# Patient Record
Sex: Male | Born: 1965
Health system: Western US, Academic
[De-identification: ages and names within clinical notes are randomized; demographics above are authoritative.]

## PROBLEM LIST (undated history)

## (undated) DIAGNOSIS — L0291 Cutaneous abscess, unspecified: Secondary | ICD-10-CM

## (undated) DIAGNOSIS — L0390 Cellulitis, unspecified: Secondary | ICD-10-CM

## (undated) HISTORY — PX: HIP SURGERY: SHX245

## (undated) HISTORY — PX: OTHER SURGICAL HISTORY: SHX169

---

## 2003-04-07 ENCOUNTER — Emergency Department (HOSPITAL_COMMUNITY): Admission: EM | Admit: 2003-04-07 | Discharge: 2003-04-07 | Payer: Self-pay | Admitting: Emergency Medicine

## 2003-04-07 ENCOUNTER — Encounter: Payer: Self-pay | Admitting: Emergency Medicine

## 2007-12-08 ENCOUNTER — Other Ambulatory Visit (HOSPITAL_COMMUNITY): Admission: RE | Admit: 2007-12-08 | Discharge: 2007-12-17 | Payer: Self-pay | Admitting: Psychiatry

## 2008-08-26 ENCOUNTER — Emergency Department (HOSPITAL_COMMUNITY): Admission: EM | Admit: 2008-08-26 | Discharge: 2008-08-26 | Payer: Self-pay | Admitting: Emergency Medicine

## 2008-11-27 ENCOUNTER — Emergency Department (HOSPITAL_COMMUNITY): Admission: EM | Admit: 2008-11-27 | Discharge: 2008-11-27 | Payer: Self-pay | Admitting: Emergency Medicine

## 2011-06-25 LAB — URINE DRUGS OF ABUSE SCREEN W ALC, ROUTINE (REF LAB)
Amphetamine Screen, Ur: NEGATIVE
Marijuana Metabolite: NEGATIVE
Opiate Screen, Urine: NEGATIVE
Propoxyphene: NEGATIVE

## 2014-06-24 ENCOUNTER — Encounter (HOSPITAL_COMMUNITY): Payer: Self-pay | Admitting: Emergency Medicine

## 2014-06-24 ENCOUNTER — Emergency Department (HOSPITAL_COMMUNITY): Payer: Self-pay

## 2014-06-24 ENCOUNTER — Emergency Department (HOSPITAL_COMMUNITY)
Admission: EM | Admit: 2014-06-24 | Discharge: 2014-06-24 | Disposition: A | Discharge status: Home / Self Care | Payer: Self-pay | Attending: Emergency Medicine | Admitting: Emergency Medicine

## 2014-06-24 DIAGNOSIS — L0390 Cellulitis, unspecified: Secondary | ICD-10-CM

## 2014-06-24 DIAGNOSIS — F17200 Nicotine dependence, unspecified, uncomplicated: Secondary | ICD-10-CM | POA: Insufficient documentation

## 2014-06-24 DIAGNOSIS — L03319 Cellulitis of trunk, unspecified: Principal | ICD-10-CM

## 2014-06-24 DIAGNOSIS — L0291 Cutaneous abscess, unspecified: Secondary | ICD-10-CM

## 2014-06-24 DIAGNOSIS — L02219 Cutaneous abscess of trunk, unspecified: Secondary | ICD-10-CM | POA: Insufficient documentation

## 2014-06-24 HISTORY — DX: Cellulitis, unspecified: L03.90

## 2014-06-24 HISTORY — DX: Cutaneous abscess, unspecified: L02.91

## 2014-06-24 MED ORDER — MORPHINE SULFATE 4 MG/ML IJ SOLN
4.0000 mg | Freq: Once | INTRAMUSCULAR | Status: AC
  Administered 2014-06-24: 4 mg via INTRAMUSCULAR
  Filled 2014-06-24: qty 1

## 2014-06-24 MED ORDER — CEPHALEXIN 500 MG PO CAPS: 500.0000 mg | ORAL_CAPSULE | Freq: Four times a day (QID) | ORAL | Status: DC

## 2014-06-24 MED ORDER — OXYCODONE-ACETAMINOPHEN 5-325 MG PO TABS: 2.0000 | ORAL_TABLET | ORAL | Status: DC | PRN

## 2014-06-24 MED ORDER — OXYCODONE-ACETAMINOPHEN 5-325 MG PO TABS
2.0000 | ORAL_TABLET | Freq: Once | ORAL | Status: AC
  Administered 2014-06-24: 2 via ORAL
  Filled 2014-06-24: qty 2

## 2014-06-24 MED ORDER — SULFAMETHOXAZOLE-TRIMETHOPRIM 800-160 MG PO TABS: 2.0000 | ORAL_TABLET | Freq: Two times a day (BID) | ORAL | Status: DC

## 2014-06-24 MED ORDER — LIDOCAINE HCL 1 % IJ SOLN
5.0000 mL | Freq: Once | INTRAMUSCULAR | Status: AC
  Administered 2014-06-24: 5 mL via INTRADERMAL
  Filled 2014-06-24: qty 20

## 2014-06-24 NOTE — Discharge Instructions (Signed)
Take Keflex and Bactrim as directed until gone. Take Percocet as needed for pain. Return to the ED in 2 days for recheck. Refer to attached documents for more information.

## 2014-06-24 NOTE — ED Provider Notes (Signed)
CSN: [id number]     Arrival date & time 06/24/14  1355 History  This chart was scribed for non-physician practitioner Emilia Beck, PA-C, working with Rolland Porter, MD by Littie Deeds, ED Scribe. This patient was seen in room WTR7/WTR7 and the patient's care was started at 2:33 PM.     Chief Complaint  Patient presents with  . Abscess      The history is provided by the patient. No language interpreter was used.   HPI Comments: Jesus Keller is a 48 y.o. male with a hx of abscesses who presents to the Emergency Department complaining of gradually a worsening abscess on his right chest with associated pain that started 9 days ago. Patient has a smaller abscess on his back. He went to a hospital in [facility] for I&D 5 days ago. 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 Patient says the symptoms have not been improving on the abx. He states his head is "spurting out stuff" and that he is not feeling well in the head. Patient notes he has been working a lot and not sleeping enough. He denies fever, abdominal pain, nausea and diarrhea. Patient has a hx of IVDA and his last injection was 6 months ago. He states that he had a flare-up with his abscesses 6 months ago.   Past Medical History  Diagnosis Date  . Abscess   . Cellulitis    Past Surgical History  Procedure Laterality Date  . Arm surgery    . Hip surgery     No family history on file. History  Substance Use Topics  . Smoking status: Light Tobacco Smoker  . Smokeless tobacco: Not on file  . Alcohol Use: Yes    Review of Systems  Skin: Positive for wound (abscesses).  All other systems reviewed and are negative.     Allergies  Review of patient's allergies indicates no known allergies.  Home Medications   Prior to Admission medications   Not on File   BP 128/87  Pulse 83  Temp(Src) 99.2 F (37.3 C) (Oral)  Resp 16  Ht  (1.88 m)  Wt 160 lb (72.576  kg)  BMI 20.53 kg/m2  SpO2 100% Physical Exam  Nursing note and vitals reviewed. Constitutional: He is oriented to person, place, and time. He appears well-developed and well-nourished. No distress.  HENT:  Head: Normocephalic and atraumatic.  Mouth/Throat: Oropharynx is clear and moist. No oropharyngeal exudate.  Eyes: EOM are normal. Pupils are equal, round, and reactive to light.  Neck: Neck supple.  Cardiovascular: Normal rate.   Pulmonary/Chest: Effort normal and breath sounds normal. No respiratory distress. He has no wheezes. He has no rales.  4x4cm raised, indurated, closed wound of right chest with overlying crusted skin and surrounding erythema. The area is tender to palpation. No streaking noted.   Abdominal: Soft. He exhibits no distension. There is no tenderness. There is no rebound.  Musculoskeletal: He exhibits no edema.  Neurological: He is alert and oriented to person, place, and time. No cranial nerve deficit.  Skin: Skin is warm and dry. No rash noted.  Psychiatric: He has a normal mood and affect. His behavior is normal.    ED Course  Procedures  DIAGNOSTIC STUDIES: Oxygen Saturation is 100% on RA, nml by my interpretation.    COORDINATION OF CARE: 2:43 PM-Discussed treatment plan which includes labs and CXR with pt at bedside and pt agreed to plan.  INCISION AND DRAINAGE PROCEDURE NOTE: Patient identification was confirmed and verbal consent was obtained. This procedure was performed by Emilia Beck at 4:30pm. Site: right chest Needle size: 25 g Anesthetic used (type and amt): lidocaine 1% without epi Blade size: 11 Drainage: small amount Complexity: Complex Packing used: yes Site anesthetized, incision made over site, wound drained and explored loculations, rinsed with copious amounts of normal saline, wound packed with sterile gauze, covered with dry, sterile dressing.  Pt tolerated procedure well without complications.  Instructions for  care discussed verbally and pt provided with additional written instructions for homecare and f/u.  Labs Review Labs Reviewed - No data to display  Imaging Review No results found.   EKG Interpretation None      MDM   Final diagnoses:  Abscess and cellulitis    Patient's abscess drained with small amount of copious drainage. Wound cultured. Patient mildly tachycardic at 102 on my exam. Patient will have Bactrim, Keflex and Percocet for pain. Patient instructed to return in 2 days for wound check. Patient instructed to return to the ED with worsening or concerning symptoms. Patient's chest xray unremarkable for acute changes.   I personally performed the services described in this documentation, which was scribed in my presence. The recorded information has been reviewed and is accurate.    Emilia Beck, PA-C 06/24/14 1708

## 2014-06-24 NOTE — ED Notes (Signed)
PT REPORTS HE LANCED WOUND WITH HIS OWN NEEDLE PER THIS DISCHARGE  INSTRUCTIONS

## 2014-06-24 NOTE — ED Provider Notes (Signed)
Medical screening examination/treatment/procedure(s) were performed by non-physician practitioner and as supervising physician I was immediately available for consultation/collaboration.   EKG Interpretation None        Audree Camel, MD 06/24/14 2322

## 2014-06-24 NOTE — ED Notes (Signed)
Pt presents with NAD. Pt reports abscess to rt chest and small one to left back. Seen 3 days ago.at [facility] (06-20-2014) for presenting complaint. I&D performed  And given RX SULFAMETHOXAZOLE. Pt here for re evaluation. "feels worse".

## 2014-06-26 ENCOUNTER — Emergency Department (HOSPITAL_COMMUNITY)
Admission: EM | Admit: 2014-06-26 | Discharge: 2014-06-26 | Disposition: A | Discharge status: Home / Self Care | Payer: Self-pay | Attending: Emergency Medicine | Admitting: Emergency Medicine

## 2014-06-26 ENCOUNTER — Encounter (HOSPITAL_COMMUNITY): Payer: Self-pay | Admitting: Emergency Medicine

## 2014-06-26 DIAGNOSIS — Z8614 Personal history of Methicillin resistant Staphylococcus aureus infection: Secondary | ICD-10-CM | POA: Insufficient documentation

## 2014-06-26 DIAGNOSIS — Z4801 Encounter for change or removal of surgical wound dressing: Secondary | ICD-10-CM | POA: Insufficient documentation

## 2014-06-26 DIAGNOSIS — Z792 Long term (current) use of antibiotics: Secondary | ICD-10-CM | POA: Insufficient documentation

## 2014-06-26 DIAGNOSIS — Z5189 Encounter for other specified aftercare: Secondary | ICD-10-CM

## 2014-06-26 DIAGNOSIS — F17200 Nicotine dependence, unspecified, uncomplicated: Secondary | ICD-10-CM | POA: Insufficient documentation

## 2014-06-26 MED ORDER — LIDOCAINE-EPINEPHRINE (PF) 2 %-1:200000 IJ SOLN: 10.0000 mL | Freq: Once | INTRAMUSCULAR | Status: DC

## 2014-06-26 MED ORDER — LIDOCAINE-EPINEPHRINE 2 %-1:100000 IJ SOLN
INTRAMUSCULAR | Status: AC
  Filled 2014-06-26: qty 1

## 2014-06-26 NOTE — ED Provider Notes (Signed)
History/physical exam/procedure(s) were performed by non-physician practitioner and as supervising physician I was immediately available for consultation/collaboration. I have reviewed all notes and am in agreement with care and plan.   Hilario Quarry, MD 06/26/14 [phone]

## 2014-06-26 NOTE — Discharge Instructions (Signed)
Wound Check Your wound appears healthy today. Your wound will heal gradually over time. Eventually a scar will form that will fade with time. FACTORS THAT AFFECT SCAR FORMATION:  People differ in the severity in which they scar.  Scar severity varies according to location, size, and the traits you inherited from your parents (genetic predisposition).  Irritation to the wound from infection, rubbing, or chemical exposure will increase the amount of scar formation. HOME CARE INSTRUCTIONS   If you were given a dressing, you should change it at least once a day or as instructed by your caregiver. If the bandage sticks, soak it off with a solution of hydrogen peroxide.  If the bandage becomes wet, dirty, or develops a bad smell, change it as soon as possible.  Look for signs of infection.  Only take over-the-counter or prescription medicines for pain, discomfort, or fever as directed by your caregiver. SEEK IMMEDIATE MEDICAL CARE IF:   You have redness, swelling, or increasing pain in the wound.  You notice pus coming from the wound.  You have a fever.  You notice a bad smell coming from the wound or dressing. Document Released: 06/23/2004 Document Revised: 12/10/2011 Document Reviewed: 09/17/2005 [facility] Patient Information 2015 [place], [place]. This information is not intended to replace advice given to you by your health care provider. Make sure you discuss any questions you have with your health care provider.  Wound Care Wound care helps prevent pain and infection.  You may need a tetanus shot if:  You cannot remember when you had your last tetanus shot.  You have never had a tetanus shot.  The injury broke your skin. If you need a tetanus shot and you choose not to have one, you may get tetanus. Sickness from tetanus can be serious. HOME CARE   Only take medicine as told by your doctor.  Clean the wound daily with mild soap and water.  Change any bandages (dressings)  as told by your doctor.  Put medicated cream and a bandage on the wound as told by your doctor.  Change the bandage if it gets wet, dirty, or starts to smell.  Take showers. Do not take baths, swim, or do anything that puts your wound under water.  Rest and raise (elevate) the wound until the pain and puffiness (swelling) are better.  Keep all doctor visits as told. GET HELP RIGHT AWAY IF:   Yellowish-white fluid (pus) comes from the wound.  Medicine does not lessen your pain.  There is a red streak going away from the wound.  You have a fever. MAKE SURE YOU:   Understand these instructions.  Will watch your condition.  Will get help right away if you are not doing well or get worse. Document Released: 06/26/2008 Document Revised: 12/10/2011 Document Reviewed: 01/21/2011 [facility] Patient Information 2015 [place], [place]. This information is not intended to replace advice given to you by your health care provider. Make sure you discuss any questions you have with your health care provider.

## 2014-06-26 NOTE — ED Provider Notes (Signed)
CSN: [id number]     Arrival date & time 06/26/14  1503 History   First MD Initiated Contact with Patient 06/26/14 1505     Chief Complaint  Patient presents with  . Wound Check     (Consider location/radiation/quality/duration/timing/severity/associated sxs/prior Treatment) HPI  Jesus Keller is a(n) 48 y.o. male who presents to the Ed for wound check. The patient has a PMH of IVDU but states he has been clean for 6 months. He also has a hx of MRSA infection. He was seen in the ED 2 days ago for an abscess of the Right Chest wall. He has been packing it at home, taking bactrim and keflex. He states that the pain is greatly improved and that redness and swelling are also resolving. He denies fevers, chills, myalgiias, streaking.  Past Medical History  Diagnosis Date  . Abscess   . Cellulitis    Past Surgical History  Procedure Laterality Date  . Arm surgery    . Hip surgery     No family history on file. History  Substance Use Topics  . Smoking status: Light Tobacco Smoker  . Smokeless tobacco: Not on file  . Alcohol Use: Yes    Review of Systems  Ten systems reviewed and are negative for acute change, except as noted in the HPI.    Allergies  Review of patient's allergies indicates no known allergies.  Home Medications   Prior to Admission medications   Medication Sig Start Date End Date Taking? Authorizing Provider  cephALEXin (KEFLEX) 500 MG capsule Take 1 capsule (500 mg total) by mouth 4 (four) times daily. 06/24/14   Emilia Beck, PA-C  oxyCODONE-acetaminophen (PERCOCET/ROXICET) 5-325 MG per tablet Take 2 tablets by mouth every 4 (four) hours as needed for moderate pain or severe pain. 06/24/14   Kaitlyn Szekalski, PA-C  sulfamethoxazole-trimethoprim (SEPTRA DS) 800-160 MG per tablet Take 2 tablets by mouth every 12 (twelve) hours. 06/24/14   Kaitlyn Szekalski, PA-C   BP 152/93  Pulse 93  Temp(Src) 98.2 F (36.8 C) (Oral)  Resp 17  SpO2 99% Physical Exam    Nursing note and vitals reviewed. Constitutional: He appears well-developed and well-nourished. No distress.  HENT:  Head: Normocephalic and atraumatic.  Eyes: Conjunctivae are normal. No scleral icterus.  Neck: Normal range of motion. Neck supple.  Cardiovascular: Normal rate, regular rhythm and normal heart sounds.   Pulmonary/Chest: Effort normal and breath sounds normal. No respiratory distress.  4x4 cm open wound with necrotic tissue and wound edge undermining. No purulent discharge. 2-3 cm of surrounding erythema without streaking. Minimal tenderness.   Abdominal: Soft. There is no tenderness.  Musculoskeletal: He exhibits no edema.  Neurological: He is alert.  Skin: Skin is warm and dry. He is not diaphoretic.  Psychiatric: His behavior is normal.      ED Course  Debridement Date/Time: 06/26/2014 3:51 PM Performed by: Arthor Captain Authorized by: Arthor Captain Consent: Verbal consent obtained. Risks and benefits: risks, benefits and alternatives were discussed Patient identity confirmed: verbally with patient Time out: Immediately prior to procedure a "time out" was called to verify the correct patient, procedure, equipment, support staff and site/side marked as required. Preparation: Patient was prepped and draped in the usual sterile fashion. Local anesthesia used: yes Anesthesia: local infiltration Local anesthetic: lidocaine 2% with epinephrine Anesthetic total: 6 ml Patient sedated: no Comments: Necrotic tissue debrided from wound using forceps and 11 blade scalpel. Curette used to scrape down to fresh, bleeding tissue. Wound cleansed thoroughly with  sterile saline. No re-packing warranted. Sterile, nonstick bandage applied.   (including critical care time) Labs Review Labs Reviewed - No data to display  Imaging Review No results found.   EKG Interpretation None      MDM   Final diagnoses:  Visit for wound check  Encounter for wound care     Patient seen for wound check. Wound debrided and cleansed. I discussed return precautions using the teach back method. Patient will be discharged. Continue to cleanse wound 2x daily and change bandage.  contiue with abx and complete course.    Arthor Captain, PA-C 06/26/14 1605

## 2014-06-26 NOTE — ED Notes (Signed)
Pt here for recheck of wound to R upper chest. Ulcerated healing wound to R upper chest wall. Pt denies pain and states he thinks area is getting better. Pt reports taking Percocet x 2 today.

## 2014-06-27 LAB — WOUND CULTURE: Special Requests: NORMAL

## 2014-06-28 ENCOUNTER — Telehealth (HOSPITAL_BASED_OUTPATIENT_CLINIC_OR_DEPARTMENT_OTHER): Payer: Self-pay | Admitting: Emergency Medicine

## 2014-06-28 NOTE — Telephone Encounter (Signed)
Post ED Visit - Positive Culture Follow-up  Culture report reviewed by antimicrobial stewardship pharmacist:  Wes Dulaney, Pharm.D., BCPS  Celedonio Miyamoto, Pharm.D., BCPS  Georgina Pillion, Pharm.D., BCPS  Brent, [address].D., BCPS, AAHIVP  Estella Husk, Pharm.D., BCPS, AAHIVP  Carly Sabat, Pharm.D.  Enzo Bi, Pharm.D.  Positive wound staph  culture Treated with Cephalexin  po caps qid x 10 days, organism sensitive to the same and no further patient follow-up is required at this time.  Berle Mull 06/28/2014, 6:12 PM

## 2016-09-13 ENCOUNTER — Emergency Department (HOSPITAL_COMMUNITY)
Admission: EM | Admit: 2016-09-13 | Discharge: 2016-09-13 | Disposition: A | Discharge status: Home / Self Care | Payer: No Typology Code available for payment source | Attending: Physician Assistant | Admitting: Physician Assistant

## 2016-09-13 ENCOUNTER — Emergency Department (HOSPITAL_COMMUNITY): Payer: No Typology Code available for payment source

## 2016-09-13 ENCOUNTER — Encounter (HOSPITAL_COMMUNITY): Payer: Self-pay | Admitting: Emergency Medicine

## 2016-09-13 DIAGNOSIS — S82191D Other fracture of upper end of right tibia, subsequent encounter for closed fracture with routine healing: Secondary | ICD-10-CM | POA: Diagnosis not present

## 2016-09-13 DIAGNOSIS — S8991XD Unspecified injury of right lower leg, subsequent encounter: Secondary | ICD-10-CM | POA: Diagnosis present

## 2016-09-13 DIAGNOSIS — Z79899 Other long term (current) drug therapy: Secondary | ICD-10-CM | POA: Insufficient documentation

## 2016-09-13 DIAGNOSIS — S82831D Other fracture of upper and lower end of right fibula, subsequent encounter for closed fracture with routine healing: Secondary | ICD-10-CM | POA: Diagnosis not present

## 2016-09-13 DIAGNOSIS — S82101D Unspecified fracture of upper end of right tibia, subsequent encounter for closed fracture with routine healing: Secondary | ICD-10-CM

## 2016-09-13 DIAGNOSIS — F17200 Nicotine dependence, unspecified, uncomplicated: Secondary | ICD-10-CM | POA: Diagnosis not present

## 2016-09-13 DIAGNOSIS — S32040D Wedge compression fracture of fourth lumbar vertebra, subsequent encounter for fracture with routine healing: Secondary | ICD-10-CM | POA: Insufficient documentation

## 2016-09-13 MED ORDER — HYDROCODONE-ACETAMINOPHEN 5-325 MG PO TABS: 1.0000 | ORAL_TABLET | Freq: Four times a day (QID) | ORAL | 0 refills | Status: DC | PRN

## 2016-09-13 MED ORDER — HYDROCODONE-ACETAMINOPHEN 5-325 MG PO TABS
2.0000 | ORAL_TABLET | Freq: Once | ORAL | Status: AC
  Administered 2016-09-13: 2 via ORAL
  Filled 2016-09-13: qty 2

## 2016-09-13 MED FILL — HYDROCODON-APAP 5-325: 5-325 | 1 days supply | Qty: 15 | Fill #0

## 2016-09-13 NOTE — ED Triage Notes (Signed)
[facility]nee [facility]. Was seen then yet persistent pain . Pt ambulated to triage with steady gait.
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
## 2016-10-24 ENCOUNTER — Emergency Department (HOSPITAL_COMMUNITY)
Admission: EM | Admit: 2016-10-24 | Discharge: 2016-10-25 | Disposition: A | Discharge status: Home / Self Care | Payer: Self-pay | Attending: Emergency Medicine | Admitting: Emergency Medicine

## 2016-10-24 ENCOUNTER — Encounter (HOSPITAL_COMMUNITY): Payer: Self-pay | Admitting: Emergency Medicine

## 2016-10-24 DIAGNOSIS — K029 Dental caries, unspecified: Secondary | ICD-10-CM | POA: Insufficient documentation

## 2016-10-24 DIAGNOSIS — K0889 Other specified disorders of teeth and supporting structures: Secondary | ICD-10-CM

## 2016-10-24 DIAGNOSIS — F17200 Nicotine dependence, unspecified, uncomplicated: Secondary | ICD-10-CM | POA: Insufficient documentation

## 2016-10-24 MED ORDER — BUPIVACAINE-EPINEPHRINE (PF) 0.5% -1:200000 IJ SOLN
1.8000 mL | Freq: Once | INTRAMUSCULAR | Status: AC
  Administered 2016-10-25: 1.8 mL
  Filled 2016-10-24: qty 1.8

## 2016-10-24 MED ORDER — LIDOCAINE VISCOUS 2 % MT SOLN: 20.0000 mL | OROMUCOSAL | 2 refills | Status: DC | PRN

## 2016-10-24 NOTE — Discharge Instructions (Signed)
You have been seen today for dental pain. You should follow up with a dentist as soon as possible. This problem will not resolve on its own without the care of a dentist. Use ibuprofen or naproxen for pain. Use the viscous lidocaine for mouth pain. Swish with the lidocaine and spit it out. Do not swallow it. ° °
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
## 2016-10-24 NOTE — ED Notes (Signed)
See EDP assessment 

## 2016-10-24 NOTE — ED Triage Notes (Signed)
Patient with dental pain on the lower left jaw.  Patient thinks he may have an abscess.  Patient is complaining of back pain also.

## 2016-10-25 MED ORDER — PENICILLIN V POTASSIUM 500 MG PO TABS: 500.0000 mg | ORAL_TABLET | Freq: Four times a day (QID) | ORAL | 0 refills | Status: AC

## 2016-10-25 MED ORDER — IBUPROFEN 800 MG PO TABS: 800.0000 mg | ORAL_TABLET | Freq: Three times a day (TID) | ORAL | 0 refills | Status: DC

## 2016-10-25 NOTE — ED Notes (Signed)
Patient in room, with sunglasses on, stating he is tripping out.  He would like something to eat and drink and wants it now.  Patient was told to go back into room, or he can leave.  Patient opted to go back into room, sandwhich and water given.
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
## 2016-11-21 ENCOUNTER — Emergency Department (HOSPITAL_COMMUNITY): Payer: Self-pay

## 2016-11-21 ENCOUNTER — Emergency Department (HOSPITAL_COMMUNITY)
Admission: EM | Admit: 2016-11-21 | Discharge: 2016-11-21 | Disposition: A | Discharge status: Home / Self Care | Payer: Self-pay | Attending: Emergency Medicine | Admitting: Emergency Medicine

## 2016-11-21 ENCOUNTER — Encounter (HOSPITAL_COMMUNITY): Payer: Self-pay | Admitting: Emergency Medicine

## 2016-11-21 DIAGNOSIS — K0889 Other specified disorders of teeth and supporting structures: Secondary | ICD-10-CM | POA: Insufficient documentation

## 2016-11-21 DIAGNOSIS — M25561 Pain in right knee: Secondary | ICD-10-CM | POA: Insufficient documentation

## 2016-11-21 DIAGNOSIS — F17200 Nicotine dependence, unspecified, uncomplicated: Secondary | ICD-10-CM | POA: Insufficient documentation

## 2016-11-21 MED ORDER — PENICILLIN V POTASSIUM 500 MG PO TABS
500.0000 mg | ORAL_TABLET | Freq: Once | ORAL | Status: AC
  Administered 2016-11-21: 500 mg via ORAL
  Filled 2016-11-21: qty 1

## 2016-11-21 MED ORDER — PENICILLIN V POTASSIUM 500 MG PO TABS: 500.0000 mg | ORAL_TABLET | Freq: Three times a day (TID) | ORAL | 0 refills | Status: AC

## 2016-11-21 MED ORDER — OXYCODONE-ACETAMINOPHEN 5-325 MG PO TABS
1.0000 | ORAL_TABLET | Freq: Once | ORAL | Status: AC
  Administered 2016-11-21: 1 via ORAL
  Filled 2016-11-21: qty 1

## 2016-11-21 NOTE — ED Provider Notes (Signed)
[facility]-EMERGENCY DEPT Provider Note   CSN: [id number] Arrival date & time: 11/21/16  0144     History   Chief Complaint No chief complaint on file.   HPI Jesus Keller is a 51 y.o. male.  HPI  51 y.o. male presents to the Emergency Department today complaining of right knee pain from previous injury x several months. No acute injuries recently. Pt just wishes to have knee "checkd out." Also notes left and right dental pain due to fillings falling out x 1 month ago. States pain is constant and has no relief with OTC motrin. No fevers. No difficulty with PO intake. No N/V. No CP/SOB/ABD pain. Notes being from [place] and waiting to go back home to see his dentist and orthopedic surgeon.   Past Medical History:  Diagnosis Date  . Abscess   . Cellulitis     There are no active problems to display for this patient.   Past Surgical History:  Procedure Laterality Date  . arm surgery    . HIP SURGERY         Home Medications    Prior to Admission medications   Medication Sig Start Date End Date Taking? Authorizing Provider  cephALEXin (KEFLEX) 500 MG capsule Take 1 capsule (500 mg total) by mouth 4 (four) times daily. 06/24/14   Emilia Beck, PA-C  HYDROcodone-acetaminophen (NORCO/VICODIN) 5-325 MG tablet Take 1-2 tablets by mouth every 6 (six) hours as needed. 09/13/16   Roxy Horseman, PA-C  ibuprofen (ADVIL,MOTRIN) 800 MG tablet Take 1 tablet (800 mg total) by mouth 3 (three) times daily. 10/25/16   Shawn C Joy, PA-C  lidocaine (XYLOCAINE) 2 % solution Use as directed 20 mLs in the mouth or throat as needed for mouth pain. 10/24/16   Shawn C Joy, PA-C  oxyCODONE-acetaminophen (PERCOCET/ROXICET) 5-325 MG per tablet Take 2 tablets by mouth every 4 (four) hours as needed for moderate pain or severe pain. 06/24/14   Kaitlyn Szekalski, PA-C  sulfamethoxazole-trimethoprim (SEPTRA DS) 800-160 MG per tablet Take 2 tablets by mouth every 12 (twelve) hours. 06/24/14   Emilia Beck, PA-C    Family History No family history on file.  Social History Social History  Substance Use Topics  . Smoking status: Light Tobacco Smoker  . Smokeless tobacco: Never Used  . Alcohol use Yes     Allergies   Patient has no known allergies.   Review of Systems Review of Systems  Constitutional: Negative for fever.  HENT: Positive for dental problem.   Musculoskeletal: Positive for arthralgias.   Physical Exam Updated Vital Signs BP 128/92 (BP Location: Right Arm)   Pulse 93   Temp 98.3 F (36.8 C) (Oral)   Resp 18   Ht 6\' 2"  (1.88 m)   Wt 77.1 kg   SpO2 96%   BMI 21.83 kg/m   Physical Exam  Constitutional: He is oriented to person, place, and time. Vital signs are normal. He appears well-developed and well-nourished.  HENT:  Head: Normocephalic and atraumatic.  Right Ear: Hearing normal.  Left Ear: Hearing normal.  No trismus. No dental abscess. No uvula deviation. No swelling noted on gingiva. Poor dentition noted with cavities present.   Eyes: Conjunctivae and EOM are normal. Pupils are equal, round, and reactive to light.  Neck: Normal range of motion. Neck supple.  Cardiovascular: Normal rate and regular rhythm.   Pulmonary/Chest: Effort normal.  Musculoskeletal:  Right Knee: Negative anterior/poster drawer bilaterally. Negative ballottement test. No varus or valgus laxity. No crepitus. No  pain with flexion or extension. No TTP of knees or ankles.   Neurological: He is alert and oriented to person, place, and time.  Skin: Skin is warm and dry.  Psychiatric: He has a normal mood and affect. His speech is normal and behavior is normal. Thought content normal.  Nursing note and vitals reviewed.  ED Treatments / Results  Labs (all labs ordered are listed, but only abnormal results are displayed) Labs Reviewed - No data to display  EKG  EKG Interpretation None       Radiology Dg Knee Complete 4 Views Right  Result Date:  11/21/2016 CLINICAL DATA:  51 year old male with prior right knee fracture presenting knee pain. EXAM: RIGHT KNEE - COMPLETE 4+ VIEW COMPARISON:  Right knee radiograph dated 09/13/2016 FINDINGS: There is no acute fracture or dislocation. Old healed proximal fibular fracture noted. There is irregularity of the proximal tibia in the region of the previously seen fracture. A linear lucency with sclerotic margin noted extending from the anterior aspect of the tibial spine along the medial aspect of the medial intercondylar tubercle representing an incompletely healed fracture line. Trace suprapatellar effusion may be present. The soft tissues appear unremarkable. IMPRESSION: 1. No acute fracture or dislocation. 2. Linear lucency along the medial intercondylar tubercle representing an incompletely healed fracture. Clinical correlation and follow-up recommended. CT or MRI may provide better evaluation if clinically indicated. 3. Old healed fracture of the proximal fibula. Electronically Signed   By: Elgie CollardArash  Radparvar M.D.   On: 11/21/2016 03:23    Procedures Procedures (including critical care time)  Medications Ordered in ED Medications - No data to display   Initial Impression / Assessment and Plan / ED Course  I have reviewed the triage vital signs and the nursing notes.  Pertinent labs & imaging results that were available during my care of the patient were reviewed by me and considered in my medical decision making (see chart for details).  Final Clinical Impressions(s) / ED Diagnoses   {I have reviewed and evaluated the relevant imaging studies.  {I have reviewed the relevant previous healthcare records.  {I obtained HPI from historian.   ED Course:  Assessment: Pt is a 51yM who presents with right knee pain as well as dental pain x 1 month after fillings have fallen out. No fevers. No N/V On exam, pt in NAD. Nontoxic/nonseptic appearing. VSS. Afebrile. Dental pain associated with dental cary but  no signs or symptoms of dental abscess with patient afebrile, non toxic appearing and swallowing secretions well. Exam unconcerning for Ludwig's angina or other deep tissue infection in neck. Prophylactic Tx with ABX. Patient X-Ray negative for obvious fracture or dislocation. Old fracture noted. Counseled patient to continue NWB if possible and follow up with Ortho Conservative therapy recommended and discussed. Patient will be discharged home & is agreeable with above plan. Returns precautions discussed. Pt appears safe for discharge. At time of discharge, Patient is in no acute distress. Vital Signs are stable. Patient is able to ambulate. Patient able to tolerate PO.   Disposition/Plan:  DC Home Additional Verbal discharge instructions given and discussed with patient.  Pt Instructed to f/u with PCP in the next week for evaluation and treatment of symptoms. Return precautions given Pt acknowledges and agrees with plan  Supervising Physician April Palumbo, MD  Final diagnoses:  Acute pain of right knee  Pain, dental    New Prescriptions New Prescriptions   No medications on file     Audry Piliyler Londynn Sonoda, PA-C  11/21/16 0446    April Palumbo, MD 11/21/16 [phone]

## 2016-11-21 NOTE — Discharge Instructions (Signed)
Please read and follow all provided instructions.  Your diagnoses today include:  1. Acute pain of right knee   2. Pain, dental     Tests performed today include: Vital signs. See below for your results today.   Medications prescribed:  Take as prescribed   Home care instructions:  Follow any educational materials contained in this packet.  Follow-up instructions: Please follow-up with your primary care provider for further evaluation of symptoms and treatment   Return instructions:  Please return to the Emergency Department if you do not get better, if you get worse, or new symptoms OR  - Fever (temperature greater than 101.75F)  - Bleeding that does not stop with holding pressure to the area    -Severe pain (please note that you may be more sore the day after your accident)  - Chest Pain  - Difficulty breathing  - Severe nausea or vomiting  - Inability to tolerate food and liquids  - Passing out  - Skin becoming red around your wounds  - Change in mental status (confusion or lethargy)  - New numbness or weakness    Please return if you have any other emergent concerns.  Additional Information:  Your vital signs today were: BP 128/92 (BP Location: Right Arm)    Pulse 93    Temp 98.3 F (36.8 C) (Oral)    Resp 18    Ht 6\' 2"  (1.88 m)    Wt 77.1 kg    SpO2 96%    BMI 21.83 kg/m  If your blood pressure (BP) was elevated above 135/85 this visit, please have this repeated by your doctor within one month. --------------

## 2016-11-21 NOTE — ED Notes (Signed)
Pt c/o left upper jaw pain r/t to tooth no edema to face noted

## 2016-11-21 NOTE — ED Triage Notes (Signed)
Pt comes with complaints of right knee pain from a previous injury and wants to get it x-rayed again to make sure it is healed as it is supposed to. Pt had previous fillings in both the left and right side that have fell out.  Reports left sided jaw pain and believes he may have an infection and needs antibiotics. Pt states he does not want a block.

## 2017-05-27 ENCOUNTER — Inpatient Hospital Stay
Admit: 2017-05-27 | Discharge: 2017-05-27 | Disposition: A | Discharge status: Home / Self Care | Payer: Medicaid HMO | Source: Home / Self Care

## 2017-05-27 DIAGNOSIS — L0501 Pilonidal cyst with abscess: Secondary | ICD-10-CM

## 2017-05-27 MED ORDER — ACETAMINOPHEN-CODEINE #3 300-30 MG PO TABS
1-2 | ORAL_TABLET | Freq: Four times a day (QID) | ORAL | 0 refills | Status: AC | PRN
Start: 2017-05-27 — End: 2017-07-01

## 2017-05-27 MED ORDER — COTRIMOXAZOLE 800-160 MG PO TABS
1 | ORAL_TABLET | Freq: Two times a day (BID) | ORAL | 0 refills | Status: AC
Start: 2017-05-27 — End: ?

## 2017-05-27 MED ORDER — CEPHALEXIN 500 MG PO CAPS
500 mg | ORAL_CAPSULE | Freq: Four times a day (QID) | ORAL | 0 refills | Status: AC
Start: 2017-05-27 — End: 2017-07-01

## 2017-05-28 ENCOUNTER — Inpatient Hospital Stay
Admit: 2017-05-28 | Discharge: 2017-05-28 | Disposition: A | Discharge status: Home / Self Care | Payer: PRIVATE HEALTH INSURANCE | Source: Home / Self Care

## 2017-05-28 DIAGNOSIS — Z5189 Encounter for other specified aftercare: Secondary | ICD-10-CM

## 2017-06-21 DIAGNOSIS — M545 Low back pain: Secondary | ICD-10-CM

## 2017-06-22 ENCOUNTER — Inpatient Hospital Stay
Admit: 2017-06-22 | Discharge: 2017-06-22 | Disposition: A | Discharge status: Home / Self Care | Payer: Medicaid HMO | Source: Home / Self Care

## 2017-06-22 MED ORDER — IBUPROFEN 600 MG PO TABS
600 mg | ORAL_TABLET | Freq: Four times a day (QID) | ORAL | 0 refills | Status: AC | PRN
Start: 2017-06-22 — End: ?

## 2017-06-30 ENCOUNTER — Inpatient Hospital Stay
Admit: 2017-06-30 | Discharge: 2017-06-30 | Disposition: A | Discharge status: Home / Self Care | Payer: Medicaid HMO | Source: Home / Self Care

## 2017-06-30 DIAGNOSIS — L0231 Cutaneous abscess of buttock: Secondary | ICD-10-CM

## 2017-06-30 MED ORDER — MUPIROCIN 2 % EX OINT
0 refills | Status: AC
Start: 2017-06-30 — End: ?

## 2017-06-30 MED ORDER — DOXYCYCLINE HYCLATE 100 MG PO CAPS
100 mg | ORAL_CAPSULE | Freq: Two times a day (BID) | ORAL | 0 refills | Status: AC
Start: 2017-06-30 — End: ?

## 2017-12-25 ENCOUNTER — Emergency Department (HOSPITAL_COMMUNITY)
Admission: EM | Admit: 2017-12-25 | Discharge: 2017-12-25 | Disposition: A | Discharge status: Home / Self Care | Payer: PRIVATE HEALTH INSURANCE | Attending: Physician Assistant | Admitting: Physician Assistant

## 2017-12-25 ENCOUNTER — Encounter (HOSPITAL_COMMUNITY): Payer: Self-pay | Admitting: Emergency Medicine

## 2017-12-25 ENCOUNTER — Other Ambulatory Visit: Payer: Self-pay

## 2017-12-25 DIAGNOSIS — R202 Paresthesia of skin: Secondary | ICD-10-CM

## 2017-12-25 DIAGNOSIS — F17210 Nicotine dependence, cigarettes, uncomplicated: Secondary | ICD-10-CM | POA: Insufficient documentation

## 2017-12-25 DIAGNOSIS — M545 Low back pain: Secondary | ICD-10-CM | POA: Insufficient documentation

## 2017-12-25 DIAGNOSIS — G8929 Other chronic pain: Secondary | ICD-10-CM

## 2017-12-25 DIAGNOSIS — R200 Anesthesia of skin: Secondary | ICD-10-CM | POA: Insufficient documentation

## 2017-12-25 MED ORDER — PREDNISONE 10 MG PO TABS: 20.0000 mg | ORAL_TABLET | Freq: Every day | ORAL | 0 refills | Status: DC

## 2017-12-25 MED ORDER — PREDNISONE 20 MG PO TABS
40.0000 mg | ORAL_TABLET | Freq: Once | ORAL | Status: AC
  Administered 2017-12-25: 40 mg via ORAL
  Filled 2017-12-25: qty 2

## 2017-12-25 MED ORDER — PREDNISONE 20 MG PO TABS: 40.0000 mg | ORAL_TABLET | Freq: Every day | ORAL | 0 refills | Status: AC

## 2017-12-25 MED FILL — predniSONE 20 MG TABS: 20 | 4 days supply | Qty: 8 | Fill #0
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
## 2017-12-25 NOTE — Discharge Instructions (Signed)
Please see the information and instructions below regarding your visit.  Your diagnoses today include:  1. Chronic bilateral low back pain, with sciatica presence unspecified   2. Numbness of toes   3. Paresthesias in right hand    About diagnosis. Most episodes of acute low back pain are self-limited. Your exam was reassuring today that the source of your pain is not affecting the spinal cord and nerves that originate in the spinal cord.   If you have a history of disc herniation or arthritis in your spine, the nerves exiting the spine on one side get inflamed. This can cause severe pain. We call this radiculopathy. We do not always know what causes the sudden inflammation.  The sensation in your hand is appearing to come from a source in your elbow.  We will treat this first.  If this does not go away, this will require further workup.  You can start by putting your elbow in extension.  I gave a picture of the splint that she would use to do this.  Tests performed today include: See side panel of your discharge paperwork for testing performed today. Vital signs are listed at the bottom of these instructions.   Medications prescribed:    Take any prescribed medications only as prescribed, and any over the counter medications only as directed on the packaging.  You are prescribed prednisone a steroid. This is a medication to help reduce inflammation in the spine.  Common side effects include upset stomach/nausea. You may take this medicine with food if this occurs. Other side effects include restlessness, difficulty sleeping, and increased sweating. Call your healthcare provider if these do not resolve after finishing the medication.  This medicine may increase your blood sugar so additional careful monitoring is needed of blood sugar if you have diabetes. Call your healthcare provider for any signs/symtpoms of high blood sugar such as confusion, feeling sleepy, more thirst, more hunger,  passing urine more often, flushing, fast breathing, or breath that smells like fruit.   Home care instructions:   Low back pain gets worse the longer you stay stationary. Please keep moving and walking as tolerated. There are exercises included in this packet to perform as tolerated for your low back pain.   Apply heat to the areas that are painful. Avoid twisting or bending your trunk to lift something. Do not lift anything above 25 lbs while recovering from this flare of low back pain.  Please follow any educational materials contained in this packet.   Follow-up instructions: Please follow-up with your primary care provider as soon as possible for further evaluation of your symptoms if they are not completely improved.   Return instructions:  Please return to the Emergency Department if you experience worsening symptoms.  Please return for any fever or chills in the setting of your back pain, weakness in the muscles of the legs, numbness in your legs and feet that is new or changing, numbness in the area where you wipe, retention of your urine, loss of bowel or bladder control, or problems with walking. Please return if you have any other emergent concerns.  Additional Information:   Your vital signs today were: BP (!) 143/111 (BP Location: Left Arm)    Pulse 92    Temp 98.2 F (36.8 C) (Oral)    Resp 18    SpO2 98%  If your blood pressure (BP) was elevated on multiple readings during this visit above 130 for the top number or above  80 for the bottom number, please have this repeated by your primary care provider within one month. --------------  Thank you for allowing [place] to participate in your care today.

## 2017-12-31 ENCOUNTER — Emergency Department (HOSPITAL_COMMUNITY): Payer: Self-pay

## 2017-12-31 ENCOUNTER — Emergency Department (HOSPITAL_COMMUNITY)
Admission: EM | Admit: 2017-12-31 | Discharge: 2017-12-31 | Disposition: A | Discharge status: Home / Self Care | Payer: Self-pay | Attending: Emergency Medicine | Admitting: Emergency Medicine

## 2017-12-31 ENCOUNTER — Other Ambulatory Visit: Payer: Self-pay

## 2017-12-31 DIAGNOSIS — Z79899 Other long term (current) drug therapy: Secondary | ICD-10-CM | POA: Insufficient documentation

## 2017-12-31 DIAGNOSIS — F17200 Nicotine dependence, unspecified, uncomplicated: Secondary | ICD-10-CM | POA: Insufficient documentation

## 2017-12-31 DIAGNOSIS — M4726 Other spondylosis with radiculopathy, lumbar region: Secondary | ICD-10-CM | POA: Insufficient documentation
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
## 2017-12-31 NOTE — ED Triage Notes (Signed)
Pt complaint of continued extremity in both feet and back pain. Pt reports he did the prednisone therapy as prescribed and the pain has worsened and the numbness has worsened slightly.

## 2017-12-31 NOTE — ED Notes (Signed)
Patient transported to MRI 

## 2017-12-31 NOTE — Discharge Instructions (Signed)
Please follow up with neurosurgery. Information listed below.   Return to the ER for any new or concerning symptoms including loss of bowel or bladder control, new weakness.

## 2017-12-31 NOTE — ED Notes (Signed)
Patient given a gown and instructed to get undressed because MRI on way to come get him.

## 2018-03-13 ENCOUNTER — Emergency Department (HOSPITAL_COMMUNITY)
Admission: EM | Admit: 2018-03-13 | Discharge: 2018-03-14 | Disposition: A | Discharge status: Home / Self Care | Payer: Self-pay | Attending: Emergency Medicine | Admitting: Emergency Medicine

## 2018-03-13 ENCOUNTER — Other Ambulatory Visit: Payer: Self-pay

## 2018-03-13 ENCOUNTER — Encounter (HOSPITAL_COMMUNITY): Payer: Self-pay

## 2018-03-13 DIAGNOSIS — K0889 Other specified disorders of teeth and supporting structures: Secondary | ICD-10-CM | POA: Insufficient documentation

## 2018-03-13 DIAGNOSIS — F17210 Nicotine dependence, cigarettes, uncomplicated: Secondary | ICD-10-CM | POA: Insufficient documentation

## 2018-03-13 DIAGNOSIS — Z79899 Other long term (current) drug therapy: Secondary | ICD-10-CM | POA: Insufficient documentation

## 2018-03-13 NOTE — ED Notes (Signed)
Bed: WTR9 Expected date:  Expected time:  Means of arrival:  Comments: 

## 2018-03-13 NOTE — ED Triage Notes (Signed)
Pt reports lower L dental pain x2 days. He reports a hx of same and is requesting antibiotics. States that he has tried motrin at home. A&Ox4. Ambulatory.

## 2018-03-14 MED ORDER — AMOXICILLIN 500 MG PO CAPS
500.0000 mg | ORAL_CAPSULE | Freq: Two times a day (BID) | ORAL | Status: DC
  Administered 2018-03-14: 500 mg via ORAL
  Filled 2018-03-14: qty 1

## 2018-03-14 MED ORDER — AMOXICILLIN 500 MG PO CAPS: 500.0000 mg | ORAL_CAPSULE | Freq: Three times a day (TID) | ORAL | 0 refills | Status: AC

## 2018-03-14 MED ORDER — TRAMADOL HCL 50 MG PO TABS
50.0000 mg | ORAL_TABLET | Freq: Once | ORAL | Status: AC
  Administered 2018-03-14: 50 mg via ORAL
  Filled 2018-03-14: qty 1

## 2018-03-14 MED ORDER — TRAMADOL HCL 50 MG PO TABS: 50.0000 mg | ORAL_TABLET | Freq: Four times a day (QID) | ORAL | 0 refills | Status: AC | PRN

## 2018-03-14 NOTE — ED Notes (Signed)
Pt came out of room and states, "Im not fucking with you, its about to get bad."

## 2018-03-14 NOTE — ED Notes (Signed)
Pt declined dc vitals.

## 2018-03-14 NOTE — ED Provider Notes (Signed)
[facility]-EMERGENCY DEPT Provider Note   CSN: [id number] Arrival date & time: 03/13/18  2157     History   Chief Complaint Chief Complaint  Patient presents with  . Dental Pain    HPI Jesus Keller is a 52 y.o. male.  HPI 52 year old Caucasian male presents to the ED for evaluation of dental pain and abscess.  Patient reports history of dental caries.  He has required antibiotics in the past.  Patient is visiting his father from [place].  The pain has not been improved with Tylenol and ibuprofen.  Patient states that antibiotics have helped him in the past.  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  Patient denies any associated fevers or chills.  Denies any difficulties breathing or swallowing.  Nothing makes symptoms better or worse.  Patient is able to tolerate p.o. fluids and food. Past Medical History:  Diagnosis Date  . Abscess   . Cellulitis     There are no active problems to display for this patient.   Past Surgical History:  Procedure Laterality Date  . arm surgery    . HIP SURGERY          Home Medications    Prior to Admission medications   Medication Sig Start Date End Date Taking? Authorizing Provider  acetaminophen (TYLENOL) 500 MG tablet Take 1,000 mg by mouth every 6 (six) hours as needed for mild pain.    [provider]  amoxicillin (AMOXIL) 500 MG capsule Take 1 capsule (500 mg total) by mouth 3 (three) times daily. 03/14/18   Rise MuLeaphart, Keniel Ralston T, PA-C  Cholecalciferol (VITAMIN D PO) Take 1 tablet by mouth daily.    [provider]  Ginger, Zingiber officinalis, (GINGER PO) Take 1 tablet by mouth daily.    [provider]  ibuprofen (ADVIL,MOTRIN) 200 MG tablet Take 800 mg by mouth every 6 (six) hours as needed for moderate pain.    [provider]  MAGNESIUM PO Take 1 tablet by mouth daily.    [provider]  naproxen (NAPROSYN) 500 MG tablet Take 500 mg by mouth 2 (two) times daily with  a meal.    [provider]  Omega-3 Fatty Acids (OMEGA 3 PO) Take 1 tablet by mouth daily.    [provider]  predniSONE (DELTASONE) 20 MG tablet Take 40 mg by mouth daily with breakfast. 4-day supply prescribed on 3/27    [provider]  Pyridoxine HCl (VITAMIN B6 PO) Take 1 tablet by mouth daily.    [provider]  traMADol (ULTRAM) 50 MG tablet Take 1 tablet (50 mg total) by mouth every 6 (six) hours as needed. 03/14/18   Rise MuLeaphart, Keaundre Thelin T, PA-C    Family History History reviewed. No pertinent family history.  Social History Social History   Tobacco Use  . Smoking status: Light Tobacco Smoker  . Smokeless tobacco: Never Used  Substance Use Topics  . Alcohol use: Yes  . Drug use: Yes    Types: Marijuana     Allergies   Patient has no known allergies.   Review of Systems Review of Systems  Constitutional: Negative for chills and fever.  HENT: Positive for dental problem. Negative for drooling and trouble swallowing.   Gastrointestinal: Negative for vomiting.     Physical Exam Updated Vital Signs BP (!) 156/105 (BP Location: Left Arm)   Pulse (!) 102   Temp 98.2 F (36.8 C) (Oral)   Resp 18   SpO2 99%  Physical Exam  Constitutional: He appears well-developed and well-nourished. No distress.  HENT:  Head: Normocephalic and atraumatic.  Mouth/Throat: Uvula is midline, oropharynx is clear and moist and mucous membranes are normal. No trismus in the jaw. Abnormal dentition. Dental caries present. No uvula swelling. No tonsillar exudate.    Poor dentition throughout the entire mouth.  There are several broken teeth.  No gingival edema or erythema.  No gross abscess.  No sublingual or submandibular swelling.  Oropharynx is clear without any edema.  Patient managing secretions tolerating airway.  No muffled voice.  Eyes: Right eye exhibits no discharge. Left eye exhibits no discharge. No scleral icterus.  Neck: Normal range of  motion. Neck supple.  No nuchal rigidity noted.  Pulmonary/Chest: No respiratory distress.  Musculoskeletal: Normal range of motion.  Neurological: He is alert.  Skin: Skin is warm and dry. Capillary refill takes less than 2 seconds. No pallor.  Psychiatric: His behavior is normal. Judgment and thought content normal.  Nursing note and vitals reviewed.    ED Treatments / Results  Labs (all labs ordered are listed, but only abnormal results are displayed) Labs Reviewed - No data to display  EKG None  Radiology No results found.  Procedures Procedures (including critical care time)  Medications Ordered in ED Medications  traMADol (ULTRAM) tablet 50 mg (has no administration in time range)  amoxicillin (AMOXIL) capsule 500 mg (has no administration in time range)     Initial Impression / Assessment and Plan / ED Course  I have reviewed the triage vital signs and the nursing notes.  Pertinent labs & imaging results that were available during my care of the patient were reviewed by me and considered in my medical decision making (see chart for details).     Patient with toothache.  No gross abscess.  Exam unconcerning for Ludwig's angina or spread of infection.  Will treat with penicillin and pain medicine.  Urged patient to follow-up with dentist.    Pt is hemodynamically stable, in NAD, & able to ambulate in the ED. Evaluation does not show pathology that would require ongoing emergent intervention or inpatient treatment. I explained the diagnosis to the patient. Pain has been managed & has no complaints prior to dc. Pt is comfortable with above plan and is stable for discharge at this time. All questions were answered prior to disposition. Strict return precautions for f/u to the ED were discussed. Encouraged follow up with PCP.    Final Clinical Impressions(s) / ED Diagnoses   Final diagnoses:  Pain, dental    ED Discharge Orders        Ordered    traMADol (ULTRAM)  50 MG tablet  Every 6 hours PRN     03/14/18 0054    amoxicillin (AMOXIL) 500 MG capsule  3 times daily     03/14/18 0054       Rise Mu, PA-C 03/14/18 0105    Molpus, Jonny Ruiz, MD 03/14/18 [phone]

## 2018-03-14 NOTE — Discharge Instructions (Addendum)
You have a dental injury/infection. It is very important that you get evaluated by a dentist as soon as possible. Call tomorrow to schedule an appointment. Ibuprofen and tylenol as needed for pain.  Have also given you tramadol which is a narcotic pain medication will make you drowsy.  Take your full course of antibiotics. Read the instructions below.  Eat a soft or liquid diet and rinse your mouth out after meals with warm water. You should see a dentist or return here at once if you have increased swelling, increased pain or uncontrolled bleeding from the site of your injury.  SEEK MEDICAL CARE IF:  You have increased pain not controlled with medicines.  You have swelling around your tooth, in your face or neck.  You have bleeding which starts, continues, or gets worse.  You have a fever >101 If you are unable to open your mouth

## 2019-11-03 IMAGING — MR MR LUMBAR SPINE W/O CM
4 of 5 series · 19 of 48 positions shown · non-contrast
Comparison: Lumbar spine x-rays dated September 13, 2016.

CLINICAL DATA: Back pain with bilateral leg radiculopathy.

EXAM:
MRI LUMBAR SPINE WITHOUT CONTRAST
TECHNIQUE: Multiplanar, multisequence MR imaging of the lumbar spine was
performed. No intravenous contrast was administered.

[Series 3: T1 · sagittal · 4.0mm · 0.51mm/px · 3 of 13 slices shown (1 of 2)]
[im 1/13]
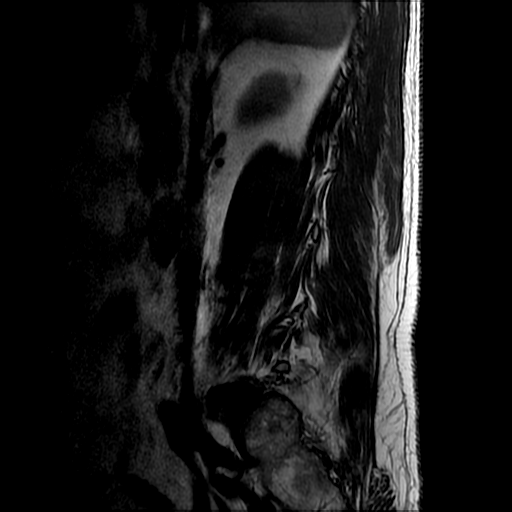
[im 7/13]
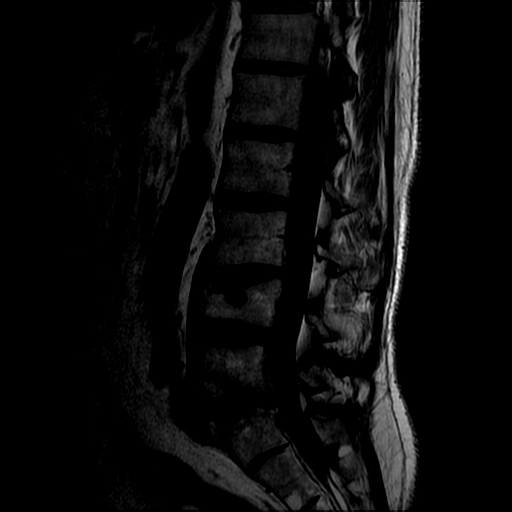
[im 13/13]
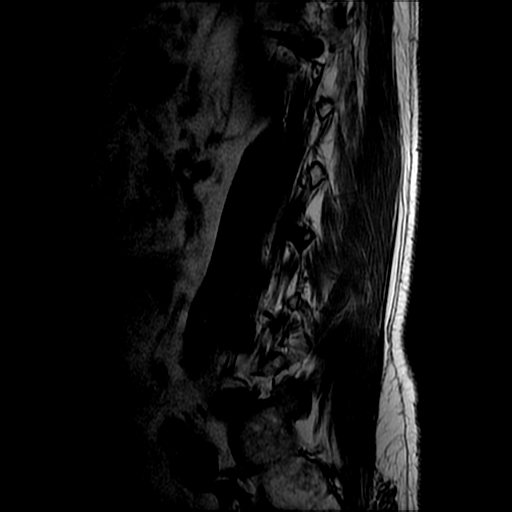

[Series 4: T2 · sagittal · 4.0mm · 0.51mm/px · 5 of 13 slices shown (1 of 2)]
[im 1/13]
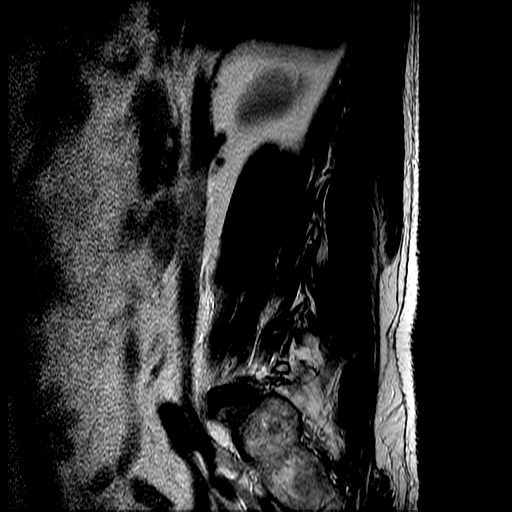
[im 4/13]
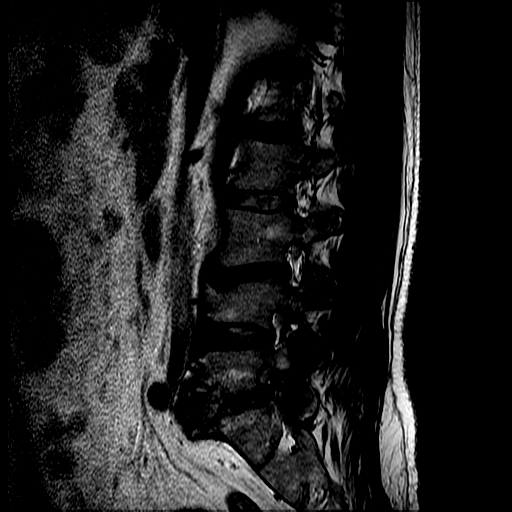
[im 7/13]
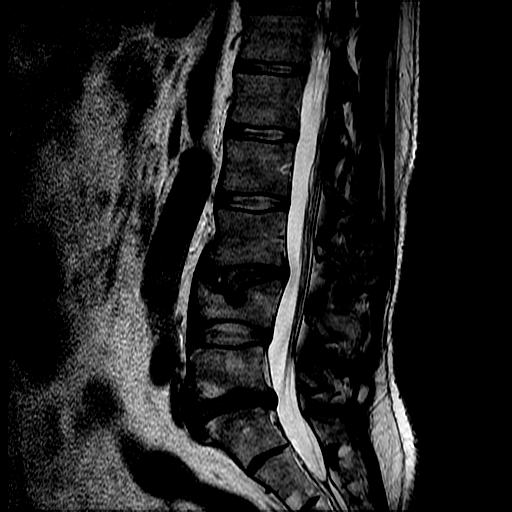
[im 10/13]
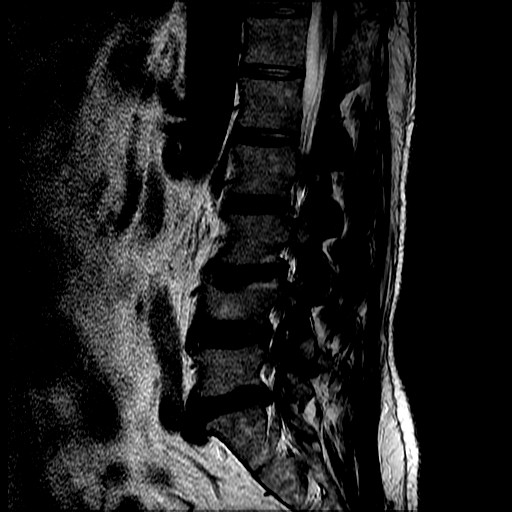
[im 13/13]
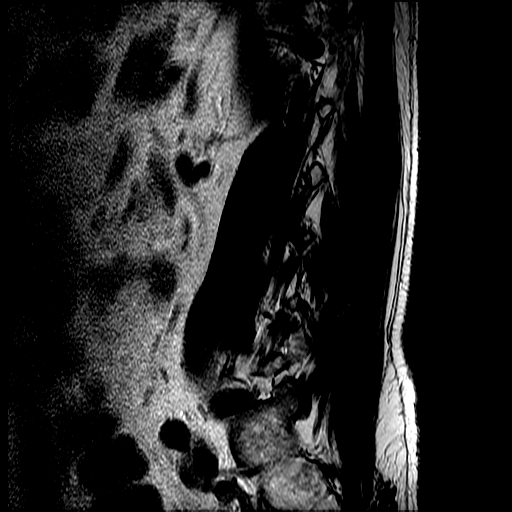

[Series 6: T2 · axial · 4.0mm · 0.39mm/px · z∈[-32,+108]mm · 8 of 36 slices shown (2 of 2)]
[im 3/36]
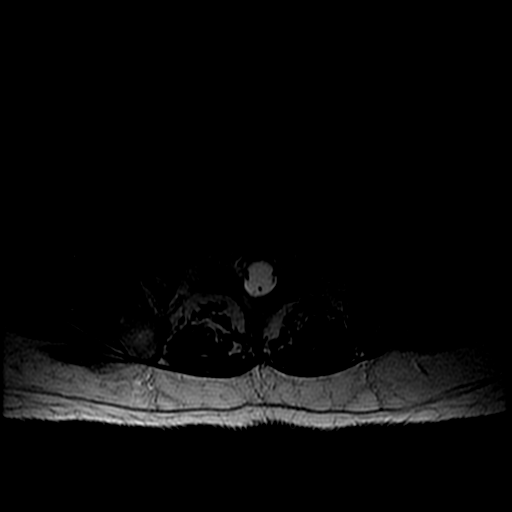
[im 5/36]
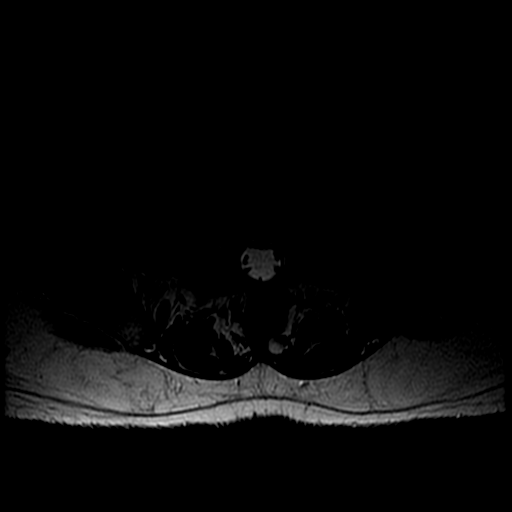
[im 8/36]
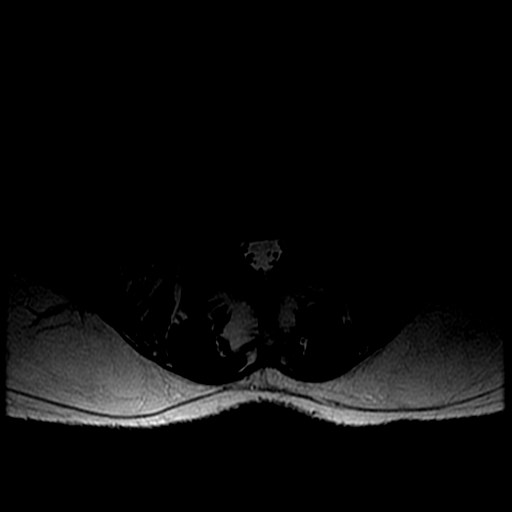
[im 12/36]
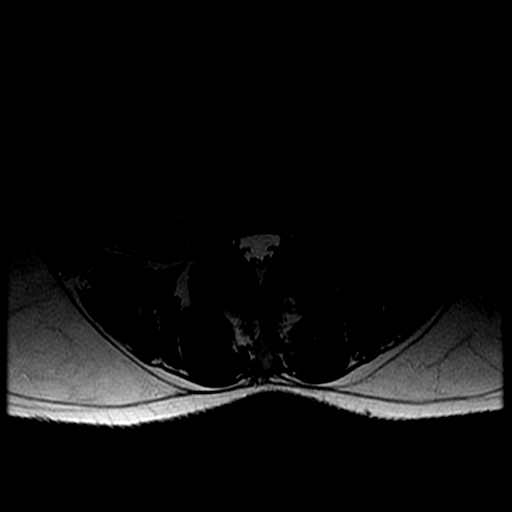
[im 17/36]
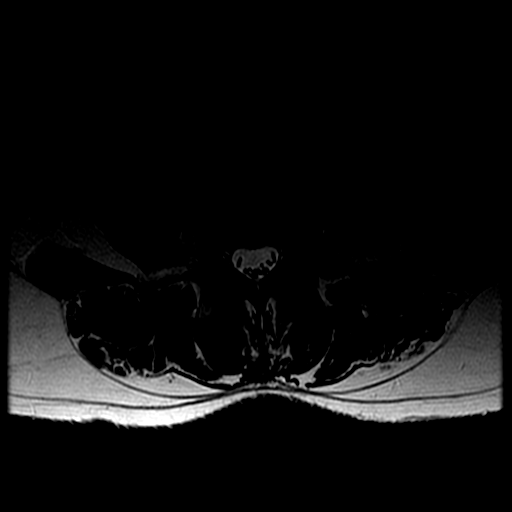
[im 19/36]
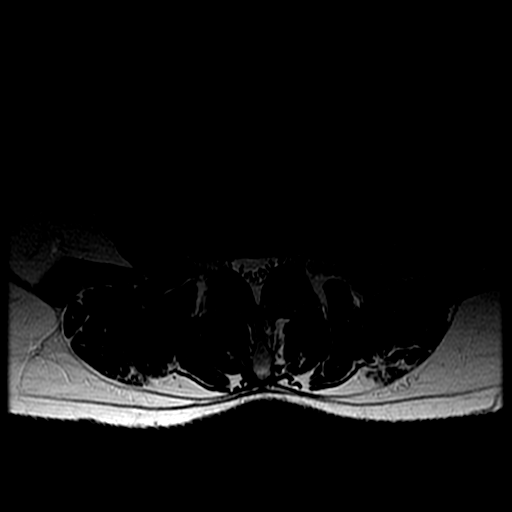
[im 22/36]
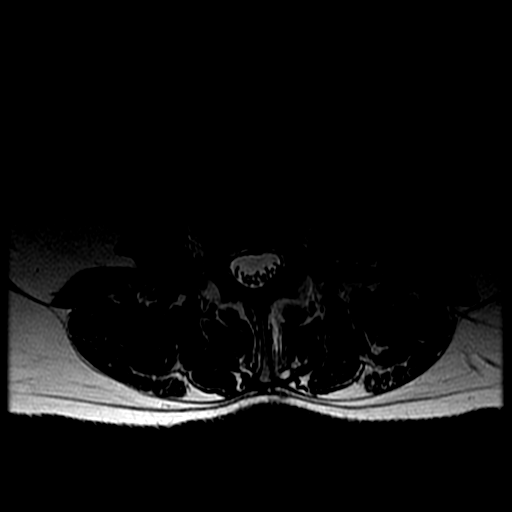
[im 31/36]
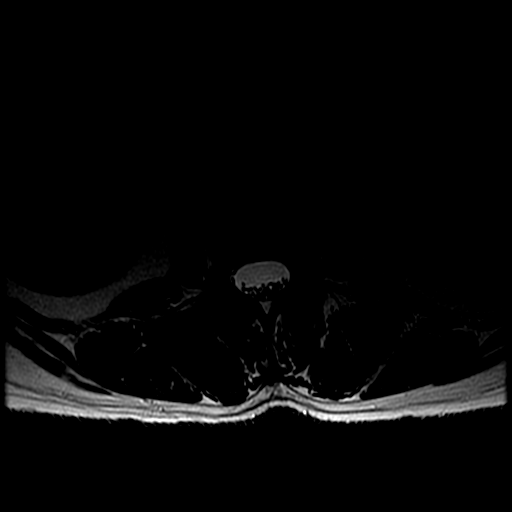

[Series 7: T1 · axial · 4.0mm · 0.39mm/px · z∈[-22,+108]mm · 3 of 36 slices shown (2 of 2)]
[im 5/36]
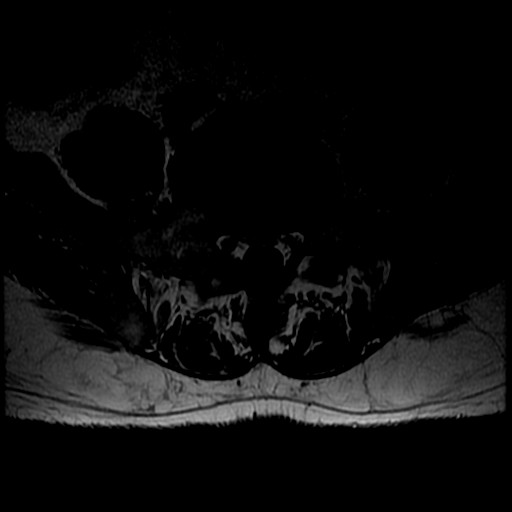
[im 19/36]
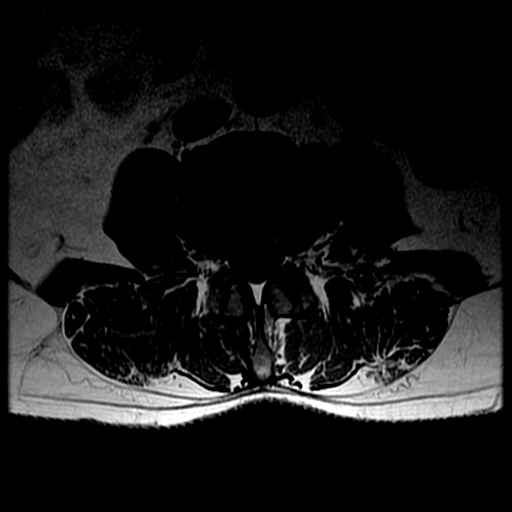
[im 31/36]
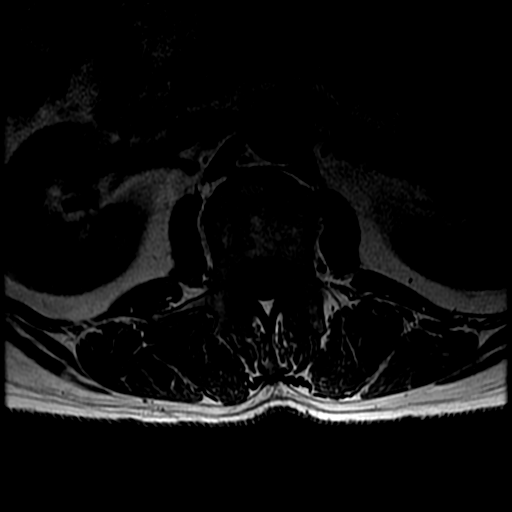

[19 of 48 positions shown; findings below may reference images not displayed]

FINDINGS: Segmentation:  Standard.

Alignment:  Unchanged slight focal kyphosis and retropulsion at L4.

Vertebrae: No acute fracture, evidence of discitis, or focal bone
lesion. Unchanged chronic L4 superior endplate compression fracture
with approximately 50% height loss centrally. Degenerative endplate
marrow edema and fatty changes at L5-S1.

Conus medullaris and cauda equina: Conus extends to the L2 level.
Conus and cauda equina appear normal.

Paraspinal and other soft tissues: Negative.

Disc levels:

T12-L1:  Only seen on the sagittal images.  Negative.

L1-L2:  Negative.

L2-L3:  Negative.

L3-L4:  Small diffuse disc bulge.  No stenosis.

L4-L5: Trace disc bulge and mild bilateral facet arthropathy. Mild
right greater than left neuroforaminal stenosis. No spinal canal
stenosis.

L5-S1: Diffuse disc bulge, asymmetric to the right. Small
superimposed central disc extrusion migrating inferiorly. Mild
bilateral facet arthropathy. Mild right lateral recess stenosis.
Moderate right neuroforaminal stenosis. No spinal canal or left
neuroforaminal stenosis.
IMPRESSION: 1. Mild degenerative changes of the lower lumbar spine, worst at
L5-S1 where there is moderate right neuroforaminal stenosis
secondary to disc bulge and facet arthropathy, possibly affecting
the exiting right L5 nerve root.
2. Mild right greater than left neuroforaminal stenosis at L4-L5.
3. Remote L4 compression fracture.

## 2020-02-04 ENCOUNTER — Telehealth: Payer: Medicaid HMO

## 2020-02-06 NOTE — Progress Notes
I was 26' late in joining but pt did not join video visit. I called his cell # 2 times and left voice mail; waited more than 20 min.

## 2020-02-08 DIAGNOSIS — Z5329 Procedure and treatment not carried out because of patient's decision for other reasons: Secondary | ICD-10-CM

## 2020-02-19 ENCOUNTER — Ambulatory Visit: Payer: Medicaid HMO

## 2020-02-19 ENCOUNTER — Telehealth: Payer: PRIVATE HEALTH INSURANCE

## 2020-02-19 NOTE — Telephone Encounter
I spoke with patient and he will check with his insurance to change medical group to [place].

## 2020-02-22 ENCOUNTER — Telehealth: Payer: Medicaid HMO

## 2020-02-22 NOTE — Telephone Encounter
Tried calling Brady Brown no answer and no voicemail

## 2020-04-13 ENCOUNTER — Ambulatory Visit: Payer: PRIVATE HEALTH INSURANCE

## 2020-04-13 ENCOUNTER — Inpatient Hospital Stay
Admit: 2020-04-13 | Discharge: 2020-04-13 | Disposition: A | Discharge status: Home / Self Care | Payer: PRIVATE HEALTH INSURANCE | Source: Home / Self Care | Attending: Emergency Medical Services

## 2020-04-13 DIAGNOSIS — S6991XS Unspecified injury of right wrist, hand and finger(s), sequela: Secondary | ICD-10-CM

## 2020-04-13 MED ADMIN — ACETAMINOPHEN 325 MG PO TABS: 650 mg | ORAL | @ 16:00:00 | Stop: 2020-04-13 | NDC 50580045811

## 2020-04-13 NOTE — ED Provider Notes
[facility]  Emergency Department Service Report    Triage     Brady Brown, a 54 y.o. male, presents with Wrist Pain (Had a broken wrist 7 or 8 months ago. States he was supposed to have surgery but never got around to it)    Arrived on 04/13/2020 at 8:34 AM   Arrived by Walk-in [14]    ED Triage Vitals [04/13/20 0836]   Temp Temp Source BP Heart Rate Resp SpO2 O2 Device Pain Score Weight   36.3 ???C (97.4 ???F) Forehead 175/117 (!) 121 20 97 % None (Room air) Four 75.7 kg (166 lb 14.2 oz)       No Known Allergies     Initial Physician Contact       Comprehensive Exam Initiated  Contact Date: 04/13/20  Contact Time: 1015    History   HPI    Past Medical History:   Diagnosis Date   ??? Abscess of arm     due to IV drug use   ??? IV drug abuse (HCC/RAF)    ??? Lumbar compression fracture (HCC/RAF)         Past Surgical History:   Procedure Laterality Date   ??? HIP SURGERY Right    ??? HIP SURGERY Right    ??? right forearm          Past Family History   family history is not on file.     Past Social History   he reports that he has never smoked. He has never used smokeless tobacco. He reports that he does not drink alcohol or use drugs. No history on file for sexual activity.     Review of Systems    Physical Exam   Physical Exam      Medical Decision Making   Brady Brown is a 54 y.o. male ***        Chart Review   Previous medical records requested.  Pertinent items reviewed: ***    ED Course      Laboratory Results   Labs Reviewed - No data to display    Imaging Results     No orders to display       Consults       Time               Service  ***:     {plan; consultations:24168}    Progress Notes / Reassessments          Time               Notes    ***:     ***    ***:  ***    ED Procedure Notes   ***    Any ED procedures performed are documented on separate ED procedure notes.    Clinical Impression   No diagnosis found.    Disposition and Follow-up   Disposition:     No future appointments.    Follow up with:  No follow-up provider specified.    Return precautions are specified on After Visit Summary.    New Prescriptions    No medications on file       Orders Placed This Encounter   ??? XR wrist pa+lat+obl right (3 views)   ??? acetaminophen tab 650 mg       Scribe Signature   I, ***, have acted as a Stage manager for [employer] on behalf of Dr. Marland Kitchen  @ on 04/13/2020 at 10:15 AM.  Resident Signature

## 2020-04-13 NOTE — Consults
04/13/2020 10:51 AM    ED CASE MANAGER NOTES            DISCHARGE NEEDS      [x]  Expedited Appointment      Received request from ED team to refer patient to  Hand Surgery, Patient has an out of network insurance, belongs to  [facility] Care  HMO, assigned to [facility]  [phone] Educated patient to follow up with PCP RE specialty/ Out of network provider authorization. Insurance Resource was provided , patient verbalized understanding.              [phone] RN  ED Case Manager   Office: [phone] or [phone]  Fax: [phone]  Pager ID: [phone]

## 2020-06-24 ENCOUNTER — Emergency Department (HOSPITAL_COMMUNITY)
Admission: EM | Admit: 2020-06-24 | Discharge: 2020-06-25 | Disposition: A | Discharge status: Home / Self Care | Payer: Self-pay | Attending: Emergency Medicine | Admitting: Emergency Medicine

## 2020-06-24 DIAGNOSIS — F39 Unspecified mood [affective] disorder: Secondary | ICD-10-CM

## 2020-06-24 DIAGNOSIS — L0390 Cellulitis, unspecified: Secondary | ICD-10-CM | POA: Insufficient documentation

## 2020-06-24 DIAGNOSIS — R440 Auditory hallucinations: Secondary | ICD-10-CM | POA: Insufficient documentation

## 2020-06-24 DIAGNOSIS — L0291 Cutaneous abscess, unspecified: Secondary | ICD-10-CM | POA: Insufficient documentation

## 2020-06-24 DIAGNOSIS — F17200 Nicotine dependence, unspecified, uncomplicated: Secondary | ICD-10-CM | POA: Insufficient documentation

## 2020-06-24 DIAGNOSIS — Z20822 Contact with and (suspected) exposure to covid-19: Secondary | ICD-10-CM | POA: Insufficient documentation

## 2020-06-24 LAB — CBC WITH DIFFERENTIAL/PLATELET
Abs Immature Granulocytes: 0.01 10*3/uL (ref 0.00–0.07)
Basophils Absolute: 0 10*3/uL (ref 0.0–0.1)
Basophils Relative: 1 %
Eosinophils Absolute: 1.1 10*3/uL — ABNORMAL HIGH (ref 0.0–0.5)
Eosinophils Relative: 14 %
HCT: 40.2 % (ref 39.0–52.0)
Hemoglobin: 13.8 g/dL (ref 13.0–17.0)
Immature Granulocytes: 0 %
Lymphocytes Relative: 28 %
Lymphs Abs: 2.2 10*3/uL (ref 0.7–4.0)
MCH: 31.2 pg (ref 26.0–34.0)
MCHC: 34.3 g/dL (ref 30.0–36.0)
MCV: 91 fL (ref 80.0–100.0)
Monocytes Absolute: 0.9 10*3/uL (ref 0.1–1.0)
Monocytes Relative: 11 %
Neutro Abs: 3.7 10*3/uL (ref 1.7–7.7)
Neutrophils Relative %: 46 %
Platelets: 267 10*3/uL (ref 150–400)
RBC: 4.42 MIL/uL (ref 4.22–5.81)
RDW: 13.6 % (ref 11.5–15.5)
WBC: 7.9 10*3/uL (ref 4.0–10.5)
nRBC: 0 % (ref 0.0–0.2)

## 2020-06-24 LAB — RAPID URINE DRUG SCREEN, HOSP PERFORMED
Amphetamines: NOT DETECTED
Barbiturates: NOT DETECTED
Benzodiazepines: NOT DETECTED
Cocaine: NOT DETECTED
Opiates: NOT DETECTED
Tetrahydrocannabinol: NOT DETECTED

## 2020-06-24 LAB — COMPREHENSIVE METABOLIC PANEL
ALT: 21 U/L (ref 0–44)
AST: 22 U/L (ref 15–41)
Albumin: 4.2 g/dL (ref 3.5–5.0)
Alkaline Phosphatase: 53 U/L (ref 38–126)
Anion gap: 9 (ref 5–15)
BUN: 19 mg/dL (ref 6–20)
CO2: 24 mmol/L (ref 22–32)
Calcium: 9 mg/dL (ref 8.9–10.3)
Chloride: 104 mmol/L (ref 98–111)
Creatinine, Ser: 0.85 mg/dL (ref 0.61–1.24)
GFR calc Af Amer: >60 mL/min (ref >60)
GFR calc non Af Amer: >60 mL/min (ref >60)
Glucose, Bld: 121 mg/dL — ABNORMAL HIGH (ref 70–99)
Potassium: 4.2 mmol/L (ref 3.5–5.1)
Sodium: 137 mmol/L (ref 135–145)
Total Bilirubin: 1 mg/dL (ref 0.3–1.2)
Total Protein: 7.1 g/dL (ref 6.5–8.1)

## 2020-06-24 LAB — SARS CORONAVIRUS 2 BY RT PCR (HOSPITAL ORDER, PERFORMED IN ~~LOC~~ HOSPITAL LAB): SARS Coronavirus 2: NEGATIVE

## 2020-06-24 LAB — ETHANOL: Alcohol, Ethyl (B): <10 mg/dL (ref <10)

## 2020-06-24 NOTE — ED Provider Notes (Addendum)
[facility]-EMERGENCY DEPT Provider Note   CSN: [id number] Arrival date & time: 06/24/20  1044     History Chief Complaint  Patient presents with  . Psychiatric Evaluation    IVC    Jesus Keller is a 54 y.o. male.  HPI   Patient presented to the ED after being involuntarily committed by family members.  According to the IVC paperwork the patient has been screaming a lot at various people as well as neighbors.  Patient has been talking to himself a lot.  Patient told his mother that an inner voice told him to break into a car.  The inner voice also wants him to kill his mother.  Patient apparently had been living homeless in [place] until recently when he moved back in with family.  Patient denies this.  He states he lives in an apartment with his mother and the only noises he has been hearing has been other people who live in the apartment  Past Medical History:  Diagnosis Date  . Abscess   . Cellulitis     There are no problems to display for this patient.   Past Surgical History:  Procedure Laterality Date  . arm surgery    . HIP SURGERY         No family history on file.  Social History   Tobacco Use  . Smoking status: Light Tobacco Smoker  . Smokeless tobacco: Never Used  Substance Use Topics  . Alcohol use: Yes  . Drug use: Yes    Types: Marijuana    Home Medications Prior to Admission medications   Medication Sig Start Date End Date Taking? Authorizing Provider  acetaminophen (TYLENOL) 500 MG tablet Take 1,000 mg by mouth every 6 (six) hours as needed for mild pain.    [provider]  amoxicillin (AMOXIL) 500 MG capsule Take 1 capsule (500 mg total) by mouth 3 (three) times daily. 03/14/18   Rise Mu, PA-C  Cholecalciferol (VITAMIN D PO) Take 1 tablet by mouth daily.    [provider]  Ginger, Zingiber officinalis, (GINGER PO) Take 1 tablet by mouth daily.    [provider]  ibuprofen  (ADVIL,MOTRIN) 200 MG tablet Take 800 mg by mouth every 6 (six) hours as needed for moderate pain.    [provider]  MAGNESIUM PO Take 1 tablet by mouth daily.    [provider]  naproxen (NAPROSYN) 500 MG tablet Take 500 mg by mouth 2 (two) times daily with a meal.    [provider]  Omega-3 Fatty Acids (OMEGA 3 PO) Take 1 tablet by mouth daily.    [provider]  predniSONE (DELTASONE) 20 MG tablet Take 40 mg by mouth daily with breakfast. 4-day supply prescribed on 3/27    [provider]  Pyridoxine HCl (VITAMIN B6 PO) Take 1 tablet by mouth daily.    [provider]  traMADol (ULTRAM) 50 MG tablet Take 1 tablet (50 mg total) by mouth every 6 (six) hours as needed. 03/14/18   Rise Mu, PA-C    Allergies    Patient has no known allergies.  Review of Systems   Review of Systems  All other systems reviewed and are negative.   Physical Exam Updated Vital Signs BP (!) 116/37 (BP Location: Left Arm)   Pulse 88   Temp 98.1 F (36.7 C) (Oral)   Resp 16   SpO2 99%   Physical Exam Vitals and nursing note  reviewed.  Constitutional:      General: He is not in acute distress.    Appearance: He is well-developed.  HENT:     Head: Normocephalic and atraumatic.     Right Ear: External ear normal.     Left Ear: External ear normal.  Eyes:     General: No scleral icterus.       Right eye: No discharge.        Left eye: No discharge.     Conjunctiva/sclera: Conjunctivae normal.  Neck:     Trachea: No tracheal deviation.  Cardiovascular:     Rate and Rhythm: Normal rate and regular rhythm.  Pulmonary:     Effort: Pulmonary effort is normal. No respiratory distress.     Breath sounds: Normal breath sounds. No stridor. No wheezing or rales.  Abdominal:     General: Bowel sounds are normal. There is no distension.     Palpations: Abdomen is soft.     Tenderness: There is no abdominal tenderness. There is no guarding  or rebound.  Musculoskeletal:        General: No tenderness.     Cervical back: Neck supple.  Skin:    General: Skin is warm and dry.     Findings: No rash.  Neurological:     Mental Status: He is alert.     Cranial Nerves: No cranial nerve deficit (no facial droop, extraocular movements intact, no slurred speech).     Sensory: No sensory deficit.     Motor: No abnormal muscle tone or seizure activity.     Coordination: Coordination normal.  Psychiatric:        Speech: Speech is not rapid and pressured.        Behavior: Behavior is cooperative.        Thought Content: Thought content does not include homicidal or suicidal ideation.     ED Results / Procedures / Treatments   Labs (all labs ordered are listed, but only abnormal results are displayed) Labs Reviewed  COMPREHENSIVE METABOLIC PANEL - Abnormal; Notable for the following components:      Result Value   Glucose, Bld 121 (*)    All other components within normal limits  CBC WITH DIFFERENTIAL/PLATELET - Abnormal; Notable for the following components:   Eosinophils Absolute 1.1 (*)    All other components within normal limits  SARS CORONAVIRUS 2 BY RT PCR (HOSPITAL ORDER, PERFORMED IN [facility])  ETHANOL  RAPID URINE DRUG SCREEN, HOSP PERFORMED    EKG None  Radiology No results found.  Procedures Procedures (including critical care time)  Medications Ordered in ED Medications - No data to display  ED Course  I have reviewed the triage vital signs and the nursing notes.  Pertinent labs & imaging results that were available during my care of the patient were reviewed by me and considered in my medical decision making (see chart for details).  Clinical Course as of Jun 24 1509  Fri Jun 24, 2020  1509 Labs reviewed.  No significant abnormalities.   [JK]  1509 Patient is medically cleared   [JK]    Clinical Course User Index [JK] Linwood Dibbles, MD   MDM Rules/Calculators/A&P                          The patient has been placed in psychiatric observation due to the need to provide a safe environment for the patient while obtaining psychiatric consultation  and evaluation, as well as ongoing medical and medication management to treat the patient's condition.  The patient has been placed under full IVC at this time.  Patient presents with involuntary commitment.  Paperwork indicates auditory hallucinations.  Patient currently denies that.  We will check laboratory tests and asked for psychiatric evaluation. Final Clinical Impression(s) / ED Diagnoses Final diagnoses:  Auditory hallucination     Linwood Dibbles, MD 06/24/20 1510    Linwood Dibbles, MD 06/24/20 [phone]

## 2020-06-24 NOTE — [facility] Assessment (Signed)
[facility] Assessment Progress Note  Case was staffed with Tate NP who recommended a inpatient admission for stabilization.   

## 2020-06-24 NOTE — BH Assessment (Signed)
Assessment Note  Jesus Keller is an 54 y.o. male that presents this date with IVC. Patient denies any S/I or AVH. Patient denies any VH although reports AH. Patient is vague in reference to content stating he "hears things."  Per IVC respondent has been residing in [place] at a homeless shelter and recently relocated back to [place] to live with family. Respondent has been talking to himself and screaming at his neighbors making threats who contacted law enforcement. Respondent told mother that he hears voices that tell him to break into something possibly a car. Respondent has per family members been hearing voices that told him to harm his mother. Patient denies content of IVC although is observed to be tangential and talks at length about his dying father with whom he resides. Patient states his father "will be dead soon" and is mother is very upset because he is trying to direct care. Patient is difficult to redirect and renders limited history. Patient denies any SA use. Patient states he recently relocated back to [facility] although gives different answers in reference to how long he has been here in the area. Patient was observed to be having a conversation while he was alone in his room prior to this Clinical research associate entering. Patient does report he "hears voices" although goes off onto a tangent as this writer attempts to clarify. Patient denies any prior mental health history, current symptoms or medication interventions associated with any mental disorder. Patient denies any prior attempts or gestures at self harm. Patient denies any history of abuse or access to firearms. Mental health history is limited per chart review.   Per admission notes this date Jesus Doctor MD writes: Patient presented to the ED after being involuntarily committed by family members.  According to the IVC paperwork the patient has been screaming a lot at various people as well as neighbors. Patient has been talking to himself a lot.  Patient told his mother that an inner voice told him to break into a car. The inner voice also wants him to kill his mother. Patient apparently had been living homeless in [place] until recently when he moved back in with family. Patient denies this. He states he lives in an apartment with his mother and the only noises he has been hearing has been other people who live in the apartment.  Patient is oriented and presents as tangential speaking in a loud pressured voice. Patient is difficult to redirect and has been observed speaking to himself and gesturing prior to this writer entering the room. Patient renders a conflicting history with thoughts somewhat disorganized. Case was staffed with Arlana Pouch NP who recommended a inpatient admission for stabilization.            Diagnosis: Unspecified psychosis   Past Medical History:  Past Medical History:  Diagnosis Date  . Abscess   . Cellulitis     Past Surgical History:  Procedure Laterality Date  . arm surgery    . HIP SURGERY      Family History: No family history on file.  Social History:  reports that he has been smoking. He has never used smokeless tobacco. He reports current alcohol use. He reports current drug use. Drug: Marijuana.  Additional Social History:  Alcohol / Drug Use Pain Medications: See MAR Prescriptions: See MAR Over the Counter: See MAR History of alcohol / drug use?: No history of alcohol / drug abuse  CIWA: CIWA-Ar BP: (!) 116/37 Pulse Rate: 88 COWS:    Allergies:  No Known Allergies  Home Medications: (Not in a hospital admission)   OB/GYN Status:  No LMP for male patient.  General Assessment Data [facility] of Assessment: [facility] TTS Assessment: In system Is this a Tele or Face-to-Face Assessment?: Face-to-Face Is this an Initial Assessment or a Re-assessment for this encounter?: Initial Assessment Patient Accompanied by:: N/A Language Other than English: No Living Arrangements: Other (Comment)  (Parents) What gender do you identify as?: Male Date Telepsych consult ordered in [facility]: 06/24/20 Marital status: Single Living Arrangements: Parent Can pt return to current living arrangement?: Yes Admission Status: Involuntary Petitioner: Family member Is patient capable of signing voluntary admission?: Yes Referral Source: Self/Family/Friend Insurance type: SP  Medical Screening Exam [facility] Walk-in ONLY) Medical Exam completed: Yes  Crisis Care Plan Living Arrangements: Parent Legal Guardian:  (NA) Name of Psychiatrist: None Name of Therapist: None  Education Status Is patient currently in school?: No Is the patient employed, unemployed or receiving disability?: Unemployed  Risk to self with the past 6 months Suicidal Ideation: No Has patient been a risk to self within the past 6 months prior to admission? : No Suicidal Intent: No Has patient had any suicidal intent within the past 6 months prior to admission? : No Is patient at risk for suicide?: No Suicidal Plan?: No Has patient had any suicidal plan within the past 6 months prior to admission? : No Access to Means: No What has been your use of drugs/alcohol within the last 12 months?: Pt denies Previous Attempts/Gestures: No How many times?: 0 Other Self Harm Risks:  (NA) Triggers for Past Attempts:  (NA) Intentional Self Injurious Behavior: None Family Suicide History: No Recent stressful life event(s): Other (Comment) (Pt cannot identify) Persecutory voices/beliefs?: No Depression:  (Pt denies) Depression Symptoms:  (Denies) Substance abuse history and/or treatment for substance abuse?: No Suicide prevention information given to non-admitted patients: Not applicable  Risk to Others within the past 6 months Homicidal Ideation: No Does patient have any lifetime risk of violence toward others beyond the six months prior to admission? : No Thoughts of Harm to Others: No Current Homicidal Intent: No Current  Homicidal Plan: No Access to Homicidal Means: No Identified Victim: NA History of harm to others?: No Assessment of Violence: None Noted Violent Behavior Description: NA Does patient have access to weapons?: No Criminal Charges Pending?: No Does patient have a court date: No Is patient on probation?: No  Psychosis Hallucinations: Auditory, Visual Delusions: None noted  Mental Status Report Appearance/Hygiene: In scrubs Eye Contact: Fair Motor Activity: Freedom of movement Speech: Rapid, Pressured Level of Consciousness: Alert Mood: Anxious Affect: Appropriate to circumstance Anxiety Level: Moderate Thought Processes: Flight of Ideas Judgement: Partial Orientation: Person, Place, Time Obsessive Compulsive Thoughts/Behaviors: None  Cognitive Functioning Concentration: Decreased Memory: Recent Intact, Remote Intact Is patient IDD: No Insight: Fair Impulse Control: Fair Appetite: Fair Have you had any weight changes? : No Change Sleep: No Change Total Hours of Sleep: 7 Vegetative Symptoms: None  ADLScreening [facility] Assessment Services) Patient's cognitive ability adequate to safely complete daily activities?: Yes Patient able to express need for assistance with ADLs?: Yes Independently performs ADLs?: Yes (appropriate for developmental age)  Prior Inpatient Therapy Prior Inpatient Therapy: No  Prior Outpatient Therapy Prior Outpatient Therapy: No Does patient have an ACCT team?: No Does patient have Intensive In-House Services?  : No Does patient have [facility] services? : No Does patient have P4CC services?: No  ADL Screening (condition at time of admission) Patient's cognitive ability  adequate to safely complete daily activities?: Yes Is the patient deaf or have difficulty hearing?: No Does the patient have difficulty seeing, even when wearing glasses/contacts?: No Does the patient have difficulty concentrating, remembering, or making decisions?: No Patient able  to express need for assistance with ADLs?: Yes Does the patient have difficulty dressing or bathing?: No Independently performs ADLs?: Yes (appropriate for developmental age) Does the patient have difficulty walking or climbing stairs?: No Weakness of Legs: None Weakness of Arms/Hands: None  Home Assistive Devices/Equipment Home Assistive Devices/Equipment: None  Therapy Consults (therapy consults require a physician order) PT Evaluation Needed: No OT Evalulation Needed: No SLP Evaluation Needed: No Abuse/Neglect Assessment (Assessment to be complete while patient is alone) Abuse/Neglect Assessment Can Be Completed: Yes Physical Abuse: Denies Verbal Abuse: Denies Sexual Abuse: Denies Exploitation of patient/patient's resources: Denies Self-Neglect: Denies Values / Beliefs Cultural Requests During Hospitalization: None Spiritual Requests During Hospitalization: None Consults Spiritual Care Consult Needed: No Transition of Care Team Consult Needed: No Advance Directives (For Healthcare) Does Patient Have a Medical Advance Directive?: No Would patient like information on creating a medical advance directive?: No - Patient declined          Disposition: Case was staffed with Arlana Pouch NP who recommended a inpatient admission for stabilization.           Disposition Initial Assessment Completed for this Encounter: Yes  On Site Evaluation by:   Reviewed with Physician:    Alfredia Ferguson 06/24/2020 5:31 PM

## 2020-06-24 NOTE — ED Triage Notes (Signed)
Pt brought to [facility] by [employer]. Pt IVC by family .  Pt Hearing voices.

## 2020-06-24 NOTE — Progress Notes (Signed)
Per Berneice Heinrich, NP patient meets inpatient criteria.  Referrals have been faxed to the following facilities:     [facility]        [facility]        [facility]        [facility]        [facility]        [facility]        [facility]       CSW will continue to follow up.  Ladoris Gene MSW,LCSWA,LCASA Clinical Social Worker  Melvin Disposition, CSW [phone] (cell)

## 2020-06-25 DIAGNOSIS — F39 Unspecified mood [affective] disorder: Secondary | ICD-10-CM

## 2020-06-25 NOTE — Discharge Instructions (Addendum)
Follow-up with outpatient behavioral health resources as recommended by TTS.

## 2020-06-25 NOTE — ED Provider Notes (Signed)
Patient has been assessed by TTS who feels as though he is safe for discharge/outpatient follow-up.  Patient will be discharged and IVC rescinded.   Geoffery Lyons, MD 06/25/20 1122

## 2020-06-25 NOTE — BH Assessment (Signed)
Patient gave consent to speak to his mother for collateral information this date to assist with care coordination. This Clinical research associate contacted Joanna Hall [phone] who reported that she has no immediate safety concerns based on patient's history although does voice that patient has made threats to her in the past and she is concerned over his ongoing mental health issues. Patient's mother voices that patient has a history of paranoia and drug use. This Clinical research associate explained to mother based on our evaluation that patient denies any current S/I, H/I or AVH and does not meet inpatient criteria. This Clinical research associate also discussed with mother that patient is declining any OP resources stating he is traveling back to [place] within the next two weeks and denies any history of mental health issues. Patient's UDS was negative for all substances with patient stating he has been maintaining his sobriety for the last year. Mother states patient will not be allowed to return to their home and is requesting that patient contact her to arrange for her to meet him in the community to obtain his personal items. This Clinical research associate informed mother that if she has any concerns in reference to her safety that she can have law enforcement present to assist with that matter. Patient was informed of current status and contacts for safety stating he will contact his mother to discuss and coordinate. Patient to be discharged later this date.

## 2020-06-25 NOTE — Consult Note (Signed)
Telepsych Consultation   Reason for Consult:  IVC'd Referring Physician:  EPD Location of Patient: [facility] Location of Provider: [facility]  Patient Identification: Jesus Keller MRN:  [id number] Principal Diagnosis: Mood disorder [facility]) Diagnosis:  Principal Problem:   Mood disorder (HCC)   Total Time spent with patient: 15 minutes  Subjective:   Jesus Keller is a 54 y.o. male was seen via was seen via tele-assessment by  counselor and nurse practitioner. He denied auditory or visual hallucinations. Denies paranoid ideations. Denies suicidal or homicidal ideations. Reports he has plans to follow-up in [place]. Additional collateral was provided to TTS counselor. Case staffed with attending psychiatrist MD Lucianne Muss. Support, encouragement and reassurance was provided.  HPI:  Per admission assessment note: Jesus Keller is an 53 y.o. male that presents this date with IVC. Patient denies any S/I or AVH. Patient denies any VH although reports AH. Patient is vague in reference to content stating he "hears things."  Per IVC respondent has been residing in [place] at a homeless shelter and recently relocated back to [place] to live with family. Respondent has been talking to himself and screaming at his neighbors making threats who contacted law enforcement. Respondent told mother that he hears voices that tell him to break into something possibly a car. Respondent has per family members been hearing voices that told him to harm his mother. Patient denies content of IVC although is observed to be tangential and talks at length about his dying father with whom he resides. Patient states his father "will be dead soon" and is mother is very upset because he is trying to direct care. Patient is difficult to redirect and renders limited history. Patient denies any SA use. Patient states he recently relocated back to [facility] although gives different answers in reference to how long he has  been here in the area. Patient was observed to be having a conversation while he was alone in his room prior to this Clinical research associate entering. Patient does report he "hears voices" although goes off onto a tangent as this writer attempts to clarify. Patient denies any prior mental health history, current symptoms or medication interventions associated with any mental disorder. Patient denies any prior attempts or gestures at self harm. Patient denies any history of abuse or access to firearms. Mental health history is limited per chart review.   Past Psychiatric History:  Risk to Self: Suicidal Ideation: No Suicidal Intent: No Is patient at risk for suicide?: No Suicidal Plan?: No Access to Means: No What has been your use of drugs/alcohol within the last 12 months?: Pt denies How many times?: 0 Other Self Harm Risks:  (NA) Triggers for Past Attempts:  (NA) Intentional Self Injurious Behavior: None Risk to Others: Homicidal Ideation: No Thoughts of Harm to Others: No Current Homicidal Intent: No Current Homicidal Plan: No Access to Homicidal Means: No Identified Victim: NA History of harm to others?: No Assessment of Violence: None Noted Violent Behavior Description: NA Does patient have access to weapons?: No Criminal Charges Pending?: No Does patient have a court date: No Prior Inpatient Therapy: Prior Inpatient Therapy: No Prior Outpatient Therapy: Prior Outpatient Therapy: No Does patient have an ACCT team?: No Does patient have Intensive In-House Services?  : No Does patient have [facility] services? : No Does patient have P4CC services?: No  Past Medical History:  Past Medical History:  Diagnosis Date  . Abscess   . Cellulitis     Past Surgical History:  Procedure Laterality Date  .  arm surgery    . HIP SURGERY     Family History: No family history on file. Family Psychiatric  History: Social History:  Social History   Substance and Sexual Activity  Alcohol Use Yes      Social History   Substance and Sexual Activity  Drug Use Yes  . Types: Marijuana    Social History   Socioeconomic History  . Marital status: Single    Spouse name: Not on file  . Number of children: Not on file  . Years of education: Not on file  . Highest education level: Not on file  Occupational History  . Not on file  Tobacco Use  . Smoking status: Light Tobacco Smoker  . Smokeless tobacco: Never Used  Substance and Sexual Activity  . Alcohol use: Yes  . Drug use: Yes    Types: Marijuana  . Sexual activity: Not on file  Other Topics Concern  . Not on file  Social History Narrative  . Not on file   Social Determinants of Health   Financial Resource Strain:   . Difficulty of Paying Living Expenses: Not on file  Food Insecurity:   . Worried About Programme researcher, broadcasting/film/video in the Last Year: Not on file  . Ran Out of Food in the Last Year: Not on file  Transportation Needs:   . Lack of Transportation (Medical): Not on file  . Lack of Transportation (Non-Medical): Not on file  Physical Activity:   . Days of Exercise per Week: Not on file  . Minutes of Exercise per Session: Not on file  Stress:   . Feeling of Stress : Not on file  Social Connections:   . Frequency of Communication with Friends and Family: Not on file  . Frequency of Social Gatherings with Friends and Family: Not on file  . Attends Religious Services: Not on file  . Active Member of Clubs or Organizations: Not on file  . Attends Banker Meetings: Not on file  . Marital Status: Not on file   Additional Social History:    Allergies:  No Known Allergies  Labs:  Results for orders placed or performed during the hospital encounter of 06/24/20 (from the past 48 hour(s))  Comprehensive metabolic panel     Status: Abnormal   Collection Time: 06/24/20 12:45 PM  Result Value Ref Range   Sodium 137 135 - 145 mmol/L   Potassium 4.2 3.5 - 5.1 mmol/L   Chloride 104 98 - 111 mmol/L   CO2 24 22  - 32 mmol/L   Glucose, Bld 121 (H) 70 - 99 mg/dL    Comment: Glucose reference range applies only to samples taken after fasting for at least 8 hours.   BUN 19 6 - 20 mg/dL   Creatinine, Ser 4.09 0.61 - 1.24 mg/dL   Calcium 9.0 8.9 - 81.1 mg/dL   Total Protein 7.1 6.5 - 8.1 g/dL   Albumin 4.2 3.5 - 5.0 g/dL   AST 22 15 - 41 U/L   ALT 21 0 - 44 U/L   Alkaline Phosphatase 53 38 - 126 U/L   Total Bilirubin 1.0 0.3 - 1.2 mg/dL   GFR calc non Af Amer >60 >60 mL/min   GFR calc Af Amer >60 >60 mL/min   Anion gap 9 5 - 15    Comment: Performed at [facility], [address] [place], [place] [address]  Ethanol     Status: None   Collection Time:  06/24/20 12:45 PM  Result Value Ref Range   Alcohol, Ethyl (B) <10 <10 mg/dL    Comment: (NOTE) Lowest detectable limit for serum alcohol is 10 mg/dL.  For medical purposes only. Performed at [facility], [address] [place], [place] [address]   CBC with Diff     Status: Abnormal   Collection Time: 06/24/20 12:45 PM  Result Value Ref Range   WBC 7.9 4.0 - 10.5 K/uL   RBC 4.42 4.22 - 5.81 MIL/uL   Hemoglobin 13.8 13.0 - 17.0 g/dL   HCT 19.140.2 39 - 52 %   MCV 91.0 80.0 - 100.0 fL   MCH 31.2 26.0 - 34.0 pg   MCHC 34.3 30.0 - 36.0 g/dL   RDW 47.813.6 29.511.5 - 62.115.5 %   Platelets 267 150 - 400 K/uL   nRBC 0.0 0.0 - 0.2 %   Neutrophils Relative % 46 %   Neutro Abs 3.7 1.7 - 7.7 K/uL   Lymphocytes Relative 28 %   Lymphs Abs 2.2 0.7 - 4.0 K/uL   Monocytes Relative 11 %   Monocytes Absolute 0.9 0 - 1 K/uL   Eosinophils Relative 14 %   Eosinophils Absolute 1.1 (H) 0 - 0 K/uL   Basophils Relative 1 %   Basophils Absolute 0.0 0 - 0 K/uL   Immature Granulocytes 0 %   Abs Immature Granulocytes 0.01 0.00 - 0.07 K/uL    Comment: Performed at [facility], [address] [place], [place] [address]  SARS Coronavirus 2 by RT PCR (hospital order, performed in [facility] hospital lab)  Nasopharyngeal Nasopharyngeal Swab     Status: None   Collection Time: 06/24/20  1:15 PM   Specimen: Nasopharyngeal Swab  Result Value Ref Range   SARS Coronavirus 2 NEGATIVE NEGATIVE    Comment: (NOTE) SARS-CoV-2 target nucleic acids are NOT DETECTED.  The SARS-CoV-2 RNA is generally detectable in upper and lower respiratory specimens during the acute phase of infection. The lowest concentration of SARS-CoV-2 viral copies this assay can detect is 250 copies / mL. A negative result does not preclude SARS-CoV-2 infection and should not be used as the sole basis for treatment or other patient management decisions.  A negative result may occur with improper specimen collection / handling, submission of specimen other than nasopharyngeal swab, presence of viral mutation(s) within the areas targeted by this assay, and inadequate number of viral copies (<250 copies / mL). A negative result must be combined with clinical observations, patient history, and epidemiological information.  Fact Sheet for Patients:   BoilerBrush.com.cyhttps://www.fda.gov/media/136312/download  Fact Sheet for Healthcare Providers: https://pope.com/https://www.fda.gov/media/136313/download  This test is not yet approved or  cleared by the [place] FDA and has been authorized for detection and/or diagnosis of SARS-CoV-2 by FDA under an Emergency Use Authorization (EUA).  This EUA will remain in effect (meaning this test can be used) for the duration of the COVID-19 declaration under Section 564(b)(1) of the Act, 21 U.S.C. section 360bbb-3(b)(1), unless the authorization is terminated or revoked sooner.  Performed at [facility], [address] [place], [place] [address]   Urine rapid drug screen (hosp performed)     Status: None   Collection Time: 06/24/20  2:46 PM  Result Value Ref Range   Opiates NONE DETECTED NONE DETECTED   Cocaine NONE DETECTED NONE DETECTED   Benzodiazepines NONE DETECTED NONE DETECTED    Amphetamines NONE DETECTED NONE DETECTED   Tetrahydrocannabinol NONE DETECTED NONE DETECTED  Barbiturates NONE DETECTED NONE DETECTED    Comment: (NOTE) DRUG SCREEN FOR MEDICAL PURPOSES ONLY.  IF CONFIRMATION IS NEEDED FOR ANY PURPOSE, NOTIFY LAB WITHIN 5 DAYS.  LOWEST DETECTABLE LIMITS FOR URINE DRUG SCREEN Drug Class                     Cutoff (ng/mL) Amphetamine and metabolites    1000 Barbiturate and metabolites    200 Benzodiazepine                 200 Tricyclics and metabolites     300 Opiates and metabolites        300 Cocaine and metabolites        300 THC                            50 Performed at [facility], [address] [place], [place] [address]     Medications:  No current facility-administered medications for this encounter.   Current Outpatient Medications  Medication Sig Dispense Refill  . acetaminophen (TYLENOL) 500 MG tablet Take 1,000 mg by mouth every 6 (six) hours as needed for mild pain.    Marland Kitchen aspirin 325 MG tablet Take 325 mg by mouth daily.    Marland Kitchen ibuprofen (ADVIL,MOTRIN) 200 MG tablet Take 800 mg by mouth every 6 (six) hours as needed for moderate pain.    . Multiple Vitamin (MULTIVITAMIN WITH MINERALS) TABS tablet Take 1 tablet by mouth daily.    . Omega-3 Fatty Acids (OMEGA 3 PO) Take 1 tablet by mouth daily.    Marland Kitchen amoxicillin (AMOXIL) 500 MG capsule Take 1 capsule (500 mg total) by mouth 3 (three) times daily. (Patient not taking: Reported on 06/24/2020) 20 capsule 0  . traMADol (ULTRAM) 50 MG tablet Take 1 tablet (50 mg total) by mouth every 6 (six) hours as needed. (Patient not taking: Reported on 06/24/2020) 10 tablet 0    Musculoskeletal:   Psychiatric Specialty Exam: Physical Exam Vitals reviewed.  Neurological:     Mental Status: He is alert and oriented to person, place, and time.     Review of Systems  All other systems reviewed and are negative.   Blood pressure 115/86, pulse 84, temperature 97.8 F (36.6 C),  temperature source Oral, resp. rate 18, SpO2 98 %.There is no height or weight on file to calculate BMI.  General Appearance: Casual  Eye Contact:  Good  Speech:  Clear and Coherent  Volume:  Normal  Mood:  Anxious and Depressed  Affect:  Congruent  Thought Process:  Coherent  Orientation:  Full (Time, Place, and Person)  Thought Content:  Logical  Suicidal Thoughts:  No  Homicidal Thoughts:  No  Memory:  Immediate;   Fair Recent;   Fair  Judgement:  Fair  Insight:  Fair  Psychomotor Activity:  Normal  Concentration:  Concentration: Fair  Recall:  [employer] of Knowledge:  Fair  Language:  Fair  Akathisia:  No  Handed:  Right  AIMS (if indicated):     Assets:  Communication Skills Desire for Improvement Resilience Social Support  ADL's:  Intact  Cognition:  WNL  Sleep:       NP spoke to EPD [place] regarding discharge disposition TTS to provided additional outpatient resources    Disposition: No evidence of imminent risk to self or others at present.   Patient does not meet criteria for psychiatric inpatient admission. Supportive  therapy provided about ongoing stressors. Refer to IOP. Discussed crisis plan, support from social network, calling 911, coming to the Emergency Department, and calling Suicide Hotline.  This service was provided via telemedicine using a 2-way, interactive audio and video technology.  Names of all persons participating in this telemedicine service and their role in this encounter. Name: Marky Buresh  Role: patient   Name: T.Ermine Spofford Role: NP  Name: D.Umberge Role: TTS        Oneta Rack, NP 06/25/2020 11:58 AM

## 2020-06-25 NOTE — ED Notes (Signed)
Patient awake resting on bed.
